# Patient Record
Sex: Male | Born: 1940 | Race: Black or African American | Hispanic: No | Marital: Married | Smoking: Current every day smoker
Health system: Southern US, Community
[De-identification: ages and names within clinical notes are randomized; demographics above are authoritative.]

## PROBLEM LIST (undated history)

## (undated) DIAGNOSIS — I10 Essential (primary) hypertension: Secondary | ICD-10-CM

## (undated) DIAGNOSIS — E119 Type 2 diabetes mellitus without complications: Secondary | ICD-10-CM

---

## 2003-05-28 ENCOUNTER — Encounter: Payer: Self-pay | Admitting: Family Medicine

## 2003-05-28 ENCOUNTER — Ambulatory Visit (HOSPITAL_COMMUNITY): Admission: RE | Admit: 2003-05-28 | Discharge: 2003-05-28 | Payer: Self-pay | Admitting: Family Medicine

## 2004-12-15 ENCOUNTER — Ambulatory Visit: Payer: Self-pay | Admitting: Family Medicine

## 2005-02-08 ENCOUNTER — Ambulatory Visit: Payer: Self-pay | Admitting: Family Medicine

## 2005-06-06 ENCOUNTER — Ambulatory Visit: Payer: Self-pay | Admitting: Family Medicine

## 2009-10-13 ENCOUNTER — Encounter: Payer: Self-pay | Admitting: Family Medicine

## 2011-03-14 ENCOUNTER — Other Ambulatory Visit (HOSPITAL_COMMUNITY): Payer: Self-pay | Admitting: Family Medicine

## 2011-03-14 DIAGNOSIS — N6452 Nipple discharge: Secondary | ICD-10-CM

## 2011-03-14 DIAGNOSIS — N6310 Unspecified lump in the right breast, unspecified quadrant: Secondary | ICD-10-CM

## 2011-03-21 ENCOUNTER — Other Ambulatory Visit (HOSPITAL_COMMUNITY): Payer: Self-pay | Admitting: Family Medicine

## 2011-03-21 ENCOUNTER — Ambulatory Visit (HOSPITAL_COMMUNITY)
Admission: RE | Admit: 2011-03-21 | Discharge: 2011-03-21 | Disposition: A | Discharge status: Home / Self Care | Payer: Medicare FFS | Source: Ambulatory Visit | Attending: Family Medicine | Admitting: Family Medicine

## 2011-03-21 ENCOUNTER — Other Ambulatory Visit: Payer: Self-pay | Admitting: Diagnostic Radiology

## 2011-03-21 DIAGNOSIS — N6310 Unspecified lump in the right breast, unspecified quadrant: Secondary | ICD-10-CM

## 2011-03-21 DIAGNOSIS — N6452 Nipple discharge: Secondary | ICD-10-CM

## 2011-03-21 DIAGNOSIS — N6039 Fibrosclerosis of unspecified breast: Secondary | ICD-10-CM | POA: Insufficient documentation

## 2011-03-21 DIAGNOSIS — N63 Unspecified lump in unspecified breast: Secondary | ICD-10-CM | POA: Insufficient documentation

## 2012-06-21 IMAGING — US US BIOPSY
1 series · 8 of 8 positions shown · non-contrast
Comparison: none

***ADDENDUM*** CREATED: 03/23/2011 [DATE]

Pathology revealed dense inflammation, including foreign body giant
cell reaction and fibrosis in the right breast. The left breast
showed benign breast parenchyma with fibrosis and chronic
inflammation. This was found to be concordant by Dr. Tiger
Ulises. Pathology was relayed to the patient's wife by the patient's
request. She stated that he had done well after the biopsy. Post
biopsy instructions were reviewed and her questions were answered.
She was encouraged to call The [REDACTED]
for any additional concerns. No additional follow-up is needed.
Pathology results are dictated by Alem Picado RN, BSN on March 23, 2011.
***END ADDENDUM*** SIGNED BY: Yama Paschal, M.D.
CLINICAL DATA: Bilateral subareolar masses, right larger than
left.

[Series 1: us biopsy · 0.08mm/px · 8 of 8 slices shown]
[im 1/8]
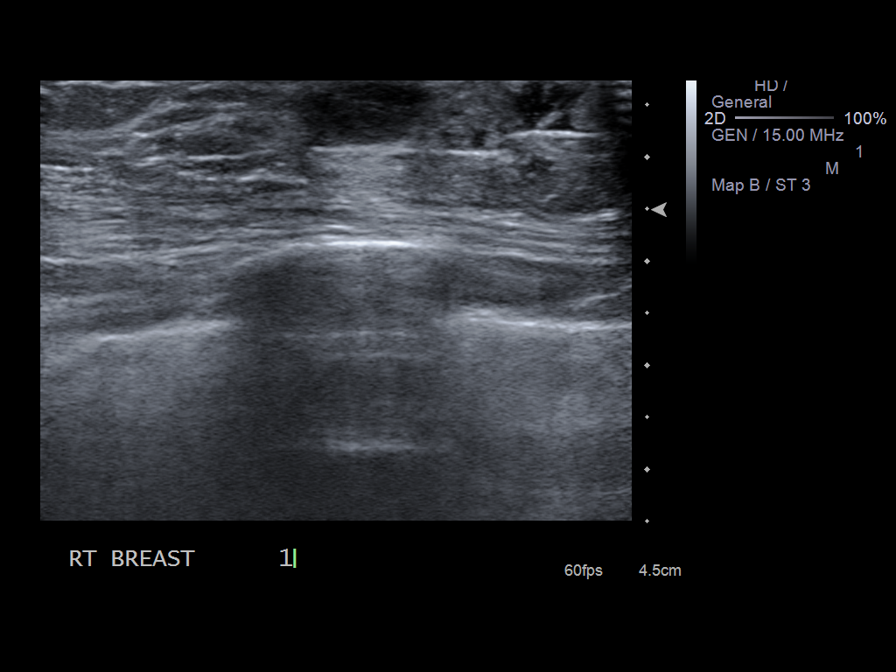
[im 2/8]
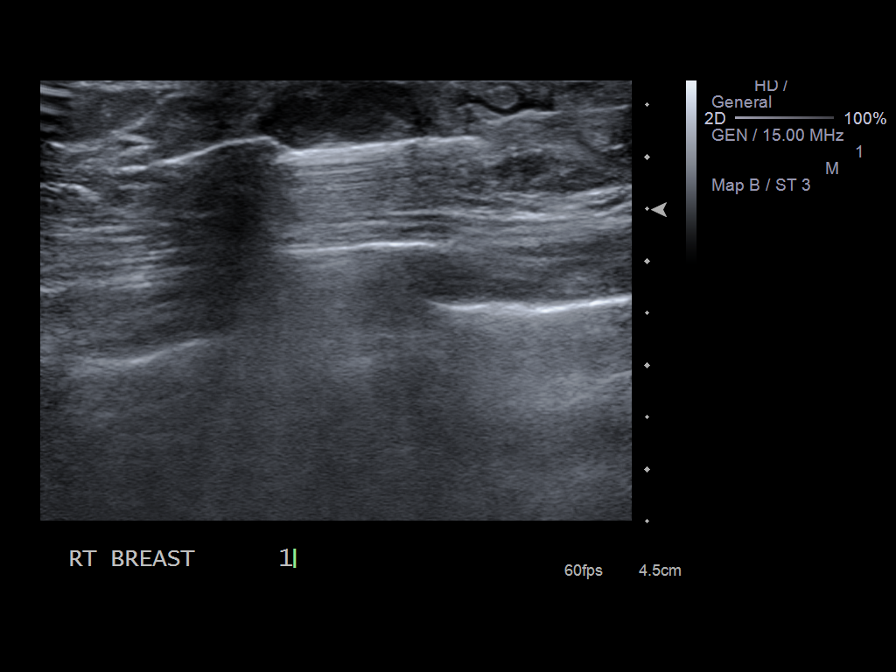
[im 3/8]
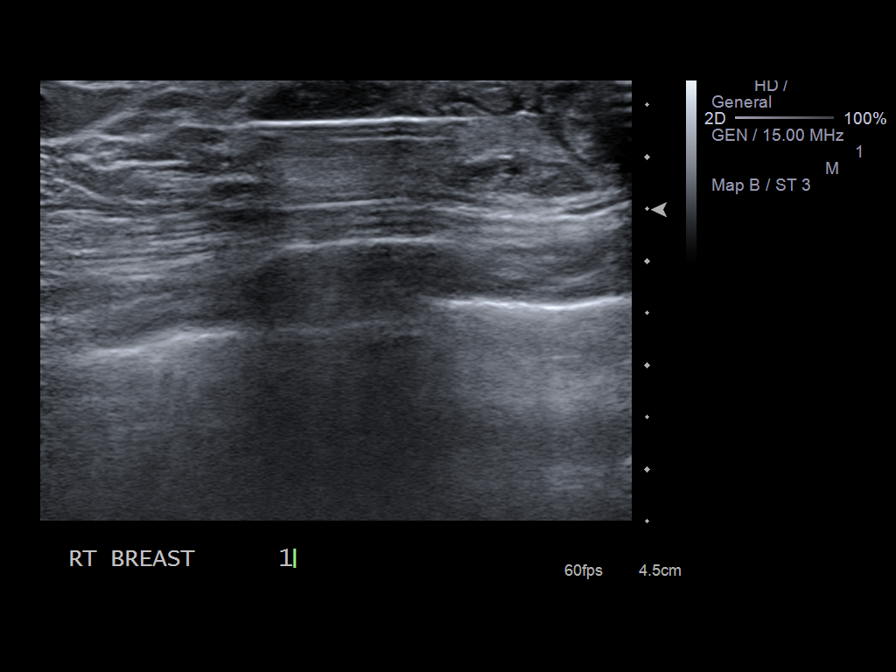
[im 4/8]
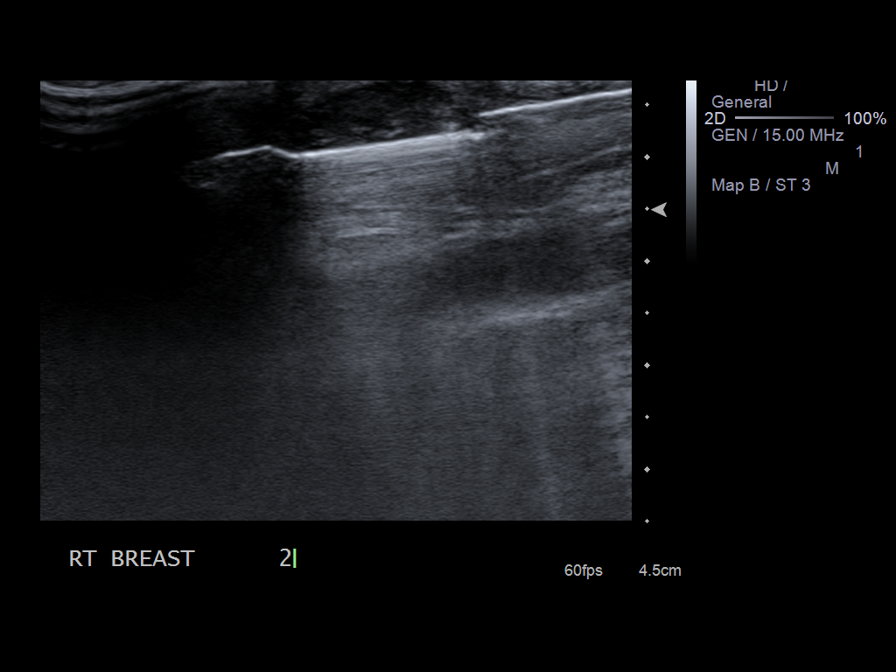
[im 5/8]
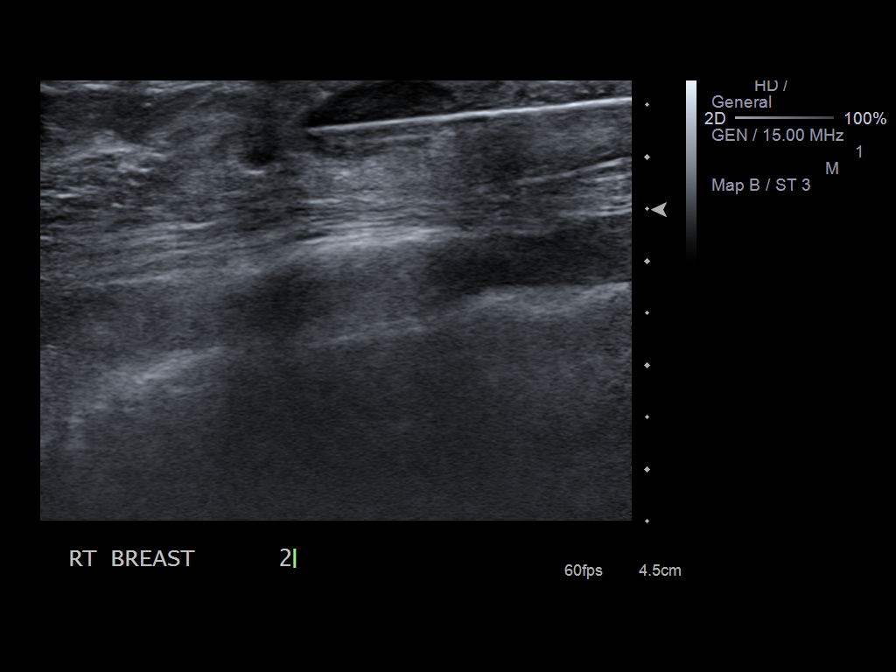
[im 6/8]
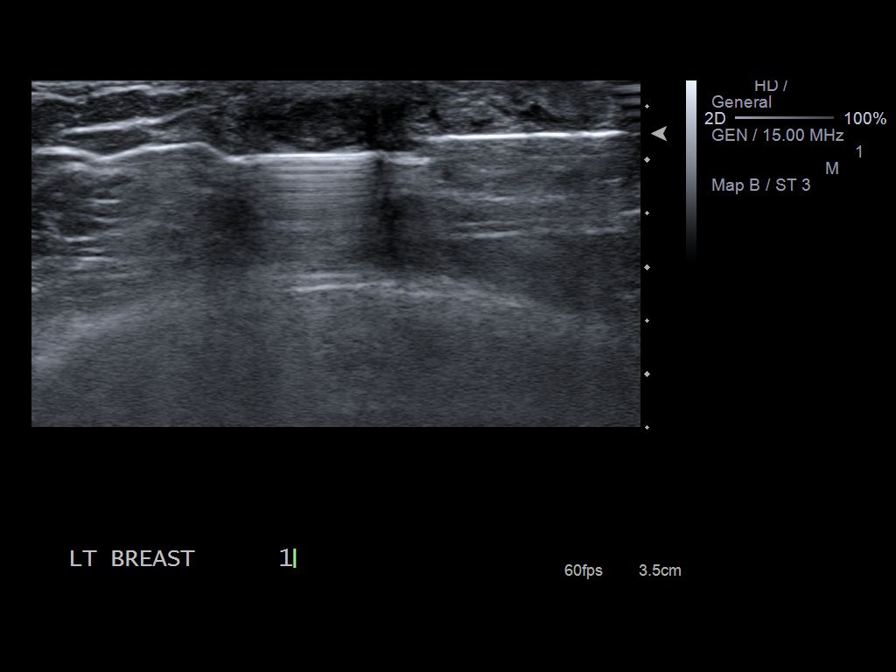
[im 7/8]
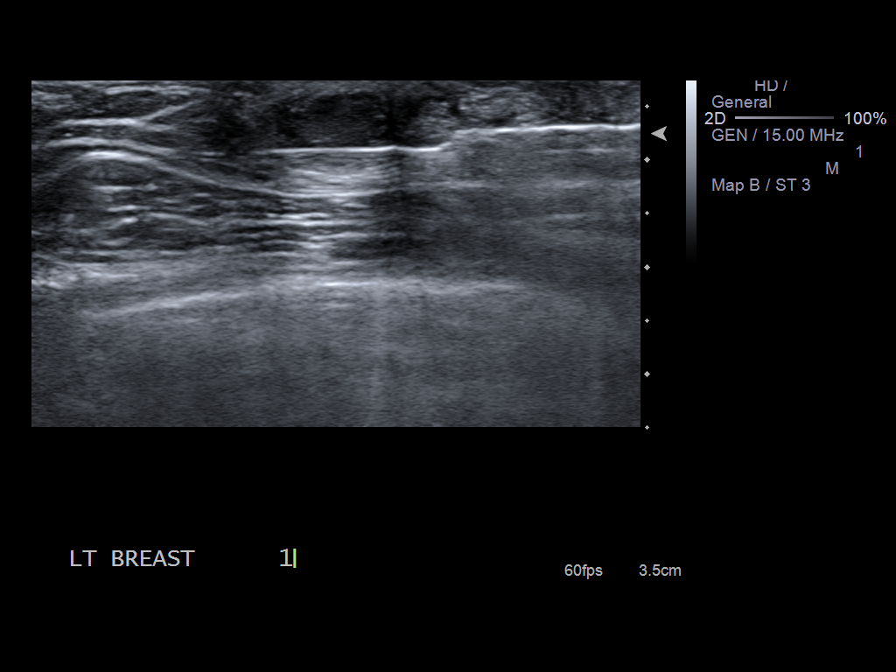
[im 8/8]
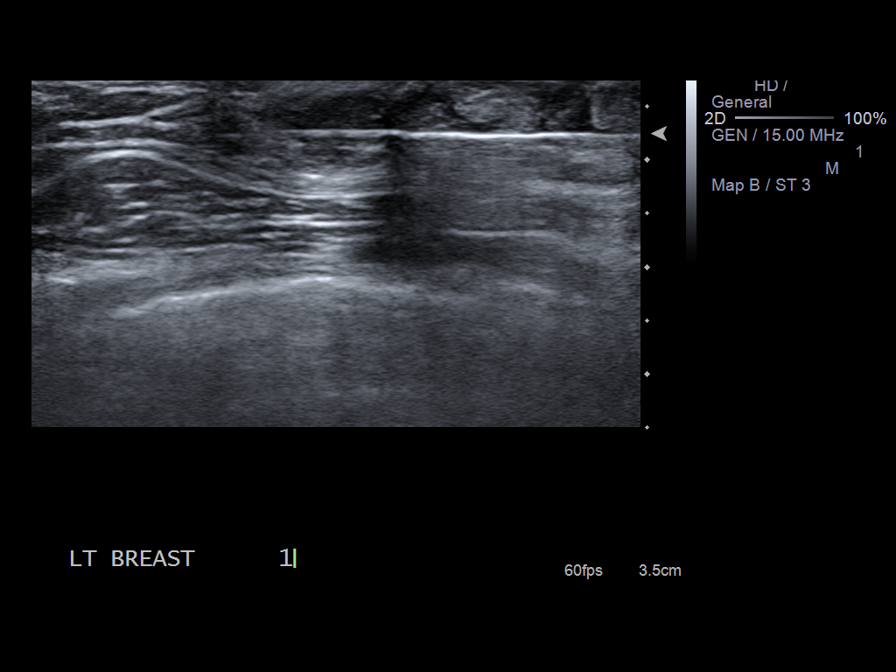

[8 of 8 positions shown; findings below may reference images not displayed]

ULTRASOUND GUIDED VACUUM ASSISTED CORE BIOPSY OF THE RIGHT AND LEFT
BREAST

The patient and I discussed the procedure of ultrasound-guided
biopsy, including risks, benefits and alternatives.  Specifically,
we discussed the risks of infection, bleeding, tissue injury and
inadequate sampling.  Informed written consent was given.

Using sterile technique, 2% lidocaine, ultrasound guidance and a 12
gauge vacuum assisted needle, biopsy was performed of the right
subareolar mass.

Using sterile technique, 2% lidocaine, ultrasound guidance and a 12
gauge vacuum-assisted needle, biopsy was performed of the left
subareolar mass.  No clips were placed.
IMPRESSION: Ultrasound-guided biopsy of a subareolar mass in each breast.
Pathologic results are pending.  No apparent complications.

## 2013-12-21 ENCOUNTER — Encounter (HOSPITAL_COMMUNITY): Payer: Self-pay | Admitting: Pharmacy Technician

## 2013-12-29 NOTE — Patient Instructions (Signed)
Your procedure is scheduled on: 01/04/2014  [facility] at  800 AM.  Call this number if you have problems the morning of surgery: [phone]   Do not eat food or drink liquids :After Midnight.      Take these medicines the morning of surgery with A SIP OF WATER: norvasc, lisinopril   Do not wear jewelry, make-up or nail polish.  Do not wear lotions, powders, or perfumes.   Do not shave 48 hours prior to surgery.  Do not bring valuables to the hospital.  Contacts, dentures or bridgework may not be worn into surgery.  Leave suitcase in the car. After surgery it may be brought to your room.  For patients admitted to the hospital, checkout time is 11:00 AM the day of discharge.   Patients discharged the day of surgery will not be allowed to drive home.  :     Please read over the following fact sheets that you were given: Coughing and Deep Breathing, Surgical Site Infection Prevention, Anesthesia Post-op Instructions and Care and Recovery After Surgery    Cataract A cataract is a clouding of the lens of the eye. When a lens becomes cloudy, vision is reduced based on the degree and nature of the clouding. Many cataracts reduce vision to some degree. Some cataracts make people more near-sighted as they develop. Other cataracts increase glare. Cataracts that are ignored and become worse can sometimes look white. The white color can be seen through the pupil. CAUSES   Aging. However, cataracts may occur at any age, even in newborns.   Certain drugs.   Trauma to the eye.   Certain diseases such as diabetes.   Specific eye diseases such as chronic inflammation inside the eye or a sudden attack of a rare form of glaucoma.   Inherited or acquired medical problems.  SYMPTOMS   Gradual, progressive drop in vision in the affected eye.   Severe, rapid visual loss. This most often happens when trauma is the cause.  DIAGNOSIS  To detect a cataract, an eye doctor examines the lens.  Cataracts are best diagnosed with an exam of the eyes with the pupils enlarged (dilated) by drops.  TREATMENT  For an early cataract, vision may improve by using different eyeglasses or stronger lighting. If that does not help your vision, surgery is the only effective treatment. A cataract needs to be surgically removed when vision loss interferes with your everyday activities, such as driving, reading, or watching TV. A cataract may also have to be removed if it prevents examination or treatment of another eye problem. Surgery removes the cloudy lens and usually replaces it with a substitute lens (intraocular lens, IOL).  At a time when both you and your doctor agree, the cataract will be surgically removed. If you have cataracts in both eyes, only one is usually removed at a time. This allows the operated eye to heal and be out of danger from any possible problems after surgery (such as infection or poor wound healing). In rare cases, a cataract may be doing damage to your eye. In these cases, your caregiver may advise surgical removal right away. The vast majority of people who have cataract surgery have better vision afterward. HOME CARE INSTRUCTIONS  If you are not planning surgery, you may be asked to do the following:  Use different eyeglasses.   Use stronger or brighter lighting.   Ask your eye doctor about reducing your medicine dose or changing medicines if  it is thought that a medicine caused your cataract. Changing medicines does not make the cataract go away on its own.   Become familiar with your surroundings. Poor vision can lead to injury. Avoid bumping into things on the affected side. You are at a higher risk for tripping or falling.   Exercise extreme care when driving or operating machinery.   Wear sunglasses if you are sensitive to bright light or experiencing problems with glare.  SEEK IMMEDIATE MEDICAL CARE IF:   You have a worsening or sudden vision loss.   You notice  redness, swelling, or increasing pain in the eye.   You have a fever.  Document Released: 11/26/2005 Document Revised: 11/15/2011 Document Reviewed: 07/20/2011 [facility] Patient Information 2012 [place].PATIENT INSTRUCTIONS POST-ANESTHESIA  IMMEDIATELY FOLLOWING SURGERY:  Do not drive or operate machinery for the first twenty four hours after surgery.  Do not make any important decisions for twenty four hours after surgery or while taking narcotic pain medications or sedatives.  If you develop intractable nausea and vomiting or a severe headache please notify your doctor immediately.  FOLLOW-UP:  Please make an appointment with your surgeon as instructed. You do not need to follow up with anesthesia unless specifically instructed to do so.  WOUND CARE INSTRUCTIONS (if applicable):  Keep a dry clean dressing on the anesthesia/puncture wound site if there is drainage.  Once the wound has quit draining you may leave it open to air.  Generally you should leave the bandage intact for twenty four hours unless there is drainage.  If the epidural site drains for more than 36-48 hours please call the anesthesia department.  QUESTIONS?:  Please feel free to call your physician or the hospital operator if you have any questions, and they will be happy to assist you.

## 2013-12-30 ENCOUNTER — Other Ambulatory Visit: Payer: Self-pay

## 2013-12-30 ENCOUNTER — Encounter (HOSPITAL_COMMUNITY): Payer: Self-pay

## 2013-12-30 ENCOUNTER — Encounter (HOSPITAL_COMMUNITY)
Admission: RE | Admit: 2013-12-30 | Discharge: 2013-12-30 | Disposition: A | Discharge status: Home / Self Care | Payer: Medicare FFS | Source: Ambulatory Visit | Attending: Ophthalmology | Admitting: Ophthalmology

## 2013-12-30 DIAGNOSIS — Z01818 Encounter for other preprocedural examination: Secondary | ICD-10-CM | POA: Insufficient documentation

## 2013-12-30 DIAGNOSIS — Z01812 Encounter for preprocedural laboratory examination: Secondary | ICD-10-CM | POA: Insufficient documentation

## 2013-12-30 DIAGNOSIS — Z01810 Encounter for preprocedural cardiovascular examination: Secondary | ICD-10-CM | POA: Insufficient documentation

## 2013-12-30 HISTORY — DX: Essential (primary) hypertension: I10

## 2013-12-30 HISTORY — DX: Type 2 diabetes mellitus without complications: E11.9

## 2013-12-30 LAB — HEMOGLOBIN AND HEMATOCRIT, BLOOD
HCT: 41.9 % (ref 39.0–52.0)
HEMOGLOBIN: 14.2 g/dL (ref 13.0–17.0)

## 2013-12-30 LAB — BASIC METABOLIC PANEL
BUN: 18 mg/dL (ref 6–23)
CHLORIDE: 99 meq/L (ref 96–112)
CO2: 26 mEq/L (ref 19–32)
CREATININE: 1.46 mg/dL — AB (ref 0.50–1.35)
Calcium: 9.7 mg/dL (ref 8.4–10.5)
GFR, EST AFRICAN AMERICAN: 54 mL/min — AB (ref >90)
GFR, EST NON AFRICAN AMERICAN: 46 mL/min — AB (ref >90)
Glucose, Bld: 230 mg/dL — ABNORMAL HIGH (ref 70–99)
Potassium: 4.4 mEq/L (ref 3.7–5.3)
Sodium: 138 mEq/L (ref 137–147)

## 2014-01-01 MED ORDER — PHENYLEPHRINE HCL 2.5 % OP SOLN
OPHTHALMIC | Status: AC
  Filled 2014-01-01: qty 15

## 2014-01-01 MED ORDER — TETRACAINE HCL 0.5 % OP SOLN
OPHTHALMIC | Status: AC
  Filled 2014-01-01: qty 2

## 2014-01-01 MED ORDER — NEOMYCIN-POLYMYXIN-DEXAMETH 3.5-10000-0.1 OP SUSP
OPHTHALMIC | Status: AC
  Filled 2014-01-01: qty 5

## 2014-01-01 MED ORDER — LIDOCAINE HCL 3.5 % OP GEL
OPHTHALMIC | Status: AC
  Filled 2014-01-01: qty 1

## 2014-01-01 MED ORDER — LIDOCAINE HCL (PF) 1 % IJ SOLN
INTRAMUSCULAR | Status: AC
  Filled 2014-01-01: qty 2

## 2014-01-01 MED ORDER — CYCLOPENTOLATE-PHENYLEPHRINE OP SOLN OPTIME - NO CHARGE
OPHTHALMIC | Status: AC
  Filled 2014-01-01: qty 2

## 2014-01-04 ENCOUNTER — Ambulatory Visit (HOSPITAL_COMMUNITY)
Admission: RE | Admit: 2014-01-04 | Discharge: 2014-01-04 | Disposition: A | Discharge status: Home / Self Care | Payer: Medicare FFS | Source: Ambulatory Visit | Attending: Ophthalmology | Admitting: Ophthalmology

## 2014-01-04 ENCOUNTER — Encounter (HOSPITAL_COMMUNITY): Payer: Self-pay | Admitting: *Deleted

## 2014-01-04 ENCOUNTER — Encounter (HOSPITAL_COMMUNITY)
Admission: RE | Disposition: A | Discharge status: Home / Self Care | Payer: Self-pay | Source: Ambulatory Visit | Attending: Ophthalmology

## 2014-01-04 ENCOUNTER — Encounter (HOSPITAL_COMMUNITY): Payer: Medicare FFS | Admitting: Anesthesiology

## 2014-01-04 ENCOUNTER — Ambulatory Visit (HOSPITAL_COMMUNITY): Payer: Medicare FFS | Admitting: Anesthesiology

## 2014-01-04 DIAGNOSIS — Z79899 Other long term (current) drug therapy: Secondary | ICD-10-CM | POA: Insufficient documentation

## 2014-01-04 DIAGNOSIS — IMO0002 Reserved for concepts with insufficient information to code with codable children: Secondary | ICD-10-CM | POA: Insufficient documentation

## 2014-01-04 DIAGNOSIS — E119 Type 2 diabetes mellitus without complications: Secondary | ICD-10-CM | POA: Insufficient documentation

## 2014-01-04 DIAGNOSIS — I10 Essential (primary) hypertension: Secondary | ICD-10-CM | POA: Insufficient documentation

## 2014-01-04 DIAGNOSIS — Z01812 Encounter for preprocedural laboratory examination: Secondary | ICD-10-CM | POA: Insufficient documentation

## 2014-01-04 HISTORY — PX: CATARACT EXTRACTION W/PHACO: SHX586

## 2014-01-04 LAB — GLUCOSE, CAPILLARY: Glucose-Capillary: 148 mg/dL — ABNORMAL HIGH (ref 70–99)

## 2014-01-04 SURGERY — PHACOEMULSIFICATION, CATARACT, WITH IOL INSERTION
Anesthesia: Monitor Anesthesia Care | Site: Eye | Laterality: Right

## 2014-01-04 MED ORDER — FENTANYL CITRATE 0.05 MG/ML IJ SOLN
25.0000 ug | INTRAMUSCULAR | Status: AC
  Administered 2014-01-04 (×2): 25 ug via INTRAVENOUS

## 2014-01-04 MED ORDER — MIDAZOLAM HCL 2 MG/2ML IJ SOLN
1.0000 mg | INTRAMUSCULAR | Status: DC | PRN
  Administered 2014-01-04: 2 mg via INTRAVENOUS

## 2014-01-04 MED ORDER — PHENYLEPHRINE HCL 2.5 % OP SOLN
1.0000 [drp] | OPHTHALMIC | Status: AC
  Administered 2014-01-04 (×3): 1 [drp] via OPHTHALMIC

## 2014-01-04 MED ORDER — EPINEPHRINE HCL 1 MG/ML IJ SOLN
INTRAMUSCULAR | Status: AC
  Filled 2014-01-04: qty 1

## 2014-01-04 MED ORDER — CYCLOPENTOLATE-PHENYLEPHRINE 0.2-1 % OP SOLN
1.0000 [drp] | OPHTHALMIC | Status: AC
  Administered 2014-01-04 (×3): 1 [drp] via OPHTHALMIC

## 2014-01-04 MED ORDER — CARBACHOL 0.01 % IO SOLN
INTRAOCULAR | Status: DC | PRN
  Administered 2014-01-04: 0.5 mL via INTRAOCULAR

## 2014-01-04 MED ORDER — POVIDONE-IODINE 5 % OP SOLN
OPHTHALMIC | Status: DC | PRN
  Administered 2014-01-04: 1 via OPHTHALMIC

## 2014-01-04 MED ORDER — LACTATED RINGERS IV SOLN
INTRAVENOUS | Status: DC
  Administered 2014-01-04: 09:00:00 via INTRAVENOUS

## 2014-01-04 MED ORDER — LIDOCAINE HCL 3.5 % OP GEL: 1.0000 "application " | Freq: Once | OPHTHALMIC | Status: DC

## 2014-01-04 MED ORDER — MIDAZOLAM HCL 2 MG/2ML IJ SOLN
INTRAMUSCULAR | Status: AC
  Filled 2014-01-04: qty 2

## 2014-01-04 MED ORDER — FENTANYL CITRATE 0.05 MG/ML IJ SOLN
INTRAMUSCULAR | Status: AC
  Filled 2014-01-04: qty 2

## 2014-01-04 MED ORDER — TETRACAINE HCL 0.5 % OP SOLN
1.0000 [drp] | OPHTHALMIC | Status: AC
  Administered 2014-01-04 (×3): 1 [drp] via OPHTHALMIC

## 2014-01-04 MED ORDER — NEOMYCIN-POLYMYXIN-DEXAMETH 3.5-10000-0.1 OP SUSP
OPHTHALMIC | Status: DC | PRN
  Administered 2014-01-04: 2 [drp] via OPHTHALMIC

## 2014-01-04 MED ORDER — LIDOCAINE HCL (PF) 1 % IJ SOLN
INTRAMUSCULAR | Status: DC | PRN
  Administered 2014-01-04: .6 mL

## 2014-01-04 MED ORDER — EPINEPHRINE HCL 1 MG/ML IJ SOLN
INTRAMUSCULAR | Status: DC | PRN
  Administered 2014-01-04: 09:00:00

## 2014-01-04 MED ORDER — LIDOCAINE 3.5 % OP GEL OPTIME - NO CHARGE
OPHTHALMIC | Status: DC | PRN
  Administered 2014-01-04: 2 [drp] via OPHTHALMIC

## 2014-01-04 MED ORDER — CARBACHOL 0.01 % IO SOLN
INTRAOCULAR | Status: AC
  Filled 2014-01-04: qty 1.5

## 2014-01-04 MED ORDER — PROVISC 10 MG/ML IO SOLN
INTRAOCULAR | Status: DC | PRN
  Administered 2014-01-04 (×2): 0.85 mL via INTRAOCULAR

## 2014-01-04 MED ORDER — TRYPAN BLUE 0.06 % OP SOLN
OPHTHALMIC | Status: DC | PRN
  Administered 2014-01-04: 0.5 mL via INTRAOCULAR

## 2014-01-04 MED ORDER — TRYPAN BLUE 0.06 % OP SOLN
OPHTHALMIC | Status: AC
  Filled 2014-01-04: qty 0.5

## 2014-01-04 MED ORDER — BSS IO SOLN
INTRAOCULAR | Status: DC | PRN
  Administered 2014-01-04: 15 mL via INTRAOCULAR

## 2014-01-04 SURGICAL SUPPLY — 33 items
CAPSULAR TENSION RING-AMO (OPHTHALMIC RELATED) IMPLANT
CLOTH BEACON ORANGE TIMEOUT ST (SAFETY) ×3 IMPLANT
EYE SHIELD UNIVERSAL CLEAR (GAUZE/BANDAGES/DRESSINGS) ×3 IMPLANT
GLOVE BIO SURGEON STRL SZ 6.5 (GLOVE) IMPLANT
GLOVE BIO SURGEONS STRL SZ 6.5 (GLOVE)
GLOVE BIOGEL PI IND STRL 6.5 (GLOVE) ×1 IMPLANT
GLOVE BIOGEL PI IND STRL 7.0 (GLOVE) IMPLANT
GLOVE BIOGEL PI IND STRL 7.5 (GLOVE) IMPLANT
GLOVE BIOGEL PI INDICATOR 6.5 (GLOVE) ×2
GLOVE BIOGEL PI INDICATOR 7.0 (GLOVE)
GLOVE BIOGEL PI INDICATOR 7.5 (GLOVE)
GLOVE ECLIPSE 6.5 STRL STRAW (GLOVE) IMPLANT
GLOVE ECLIPSE 7.0 STRL STRAW (GLOVE) IMPLANT
GLOVE ECLIPSE 7.5 STRL STRAW (GLOVE) IMPLANT
GLOVE EXAM NITRILE LRG STRL (GLOVE) IMPLANT
GLOVE EXAM NITRILE MD LF STRL (GLOVE) ×3 IMPLANT
GLOVE SKINSENSE NS SZ6.5 (GLOVE)
GLOVE SKINSENSE NS SZ7.0 (GLOVE)
GLOVE SKINSENSE STRL SZ6.5 (GLOVE) IMPLANT
GLOVE SKINSENSE STRL SZ7.0 (GLOVE) IMPLANT
KIT VITRECTOMY (OPHTHALMIC RELATED) IMPLANT
PAD ARMBOARD 7.5X6 YLW CONV (MISCELLANEOUS) ×3 IMPLANT
PROC W NO LENS (INTRAOCULAR LENS)
PROC W SPEC LENS (INTRAOCULAR LENS)
PROCESS W NO LENS (INTRAOCULAR LENS) IMPLANT
PROCESS W SPEC LENS (INTRAOCULAR LENS) IMPLANT
RING MALYGIN (MISCELLANEOUS) IMPLANT
SIGHTPATH CAT PROC W REG LENS (Ophthalmic Related) ×3 IMPLANT
SYR TB 1ML LL NO SAFETY (SYRINGE) ×3 IMPLANT
TAPE SURG TRANSPORE 1 IN (GAUZE/BANDAGES/DRESSINGS) ×1 IMPLANT
TAPE SURGICAL TRANSPORE 1 IN (GAUZE/BANDAGES/DRESSINGS) ×2
VISCOELASTIC ADDITIONAL (OPHTHALMIC RELATED) IMPLANT
WATER STERILE IRR 250ML POUR (IV SOLUTION) ×3 IMPLANT

## 2014-01-04 NOTE — Transfer of Care (Signed)
Immediate Anesthesia Transfer of Care Note  Patient: Alan Watson  Procedure(s) Performed: Procedure(s) with comments: CATARACT EXTRACTION PHACO AND INTRAOCULAR LENS PLACEMENT (IOC) (Right) - CDE:  8.07  Patient Location: Short Stay  Anesthesia Type:MAC  Level of Consciousness: awake  Airway & Oxygen Therapy: Patient Spontanous Breathing  Post-op Assessment: Report given to PACU RN  Post vital signs: Reviewed  Complications: No apparent anesthesia complications

## 2014-01-04 NOTE — Discharge Instructions (Signed)
Monitored Anesthesia Care  °Monitored anesthesia care is an anesthesia service for a medical procedure. Anesthesia is the loss of the ability to feel pain. It is produced by medications called anesthetics. It may affect a small area of your body (local anesthesia), a large area of your body (regional anesthesia), or your entire body (general anesthesia). The need for monitored anesthesia care depends your procedure, your condition, and the potential need for regional or general anesthesia. It is often provided during procedures where:  °· General anesthesia may be needed if there are complications. This is because you need special care when you are under general anesthesia.   °· You will be under local or regional anesthesia. This is so that you are able to have higher levels of anesthesia if needed.   °· You will receive calming medications (sedatives). This is especially the case if sedatives are given to put you in a semi-conscious state of relaxation (deep sedation). This is because the amount of sedative needed to produce this state can be hard to predict. Too much of a sedative can produce general anesthesia. °Monitored anesthesia care is performed by one or more caregivers who have special training in all types of anesthesia. You will need to meet with these caregivers before your procedure. During this meeting, they will ask you about your medical history. They will also give you instructions to follow. (For example, you will need to stop eating and drinking before your procedure. You may also need to stop or change medications you are taking.) During your procedure, your caregivers will stay with you. They will:  °· Watch your condition. This includes watching you blood pressure, breathing, and level of pain.   °· Diagnose and treat problems that occur.   °· Give medications if they are needed. These may include calming medications (sedatives) and anesthetics.   °· Make sure you are comfortable.   °Having  monitored anesthesia care does not necessarily mean that you will be under anesthesia. It does mean that your caregivers will be able to manage anesthesia if you need it or if it occurs. It also means that you will be able to have a different type of anesthesia than you are having if you need it. When your procedure is complete, your caregivers will continue to watch your condition. They will make sure any medications wear off before you are allowed to go home.  °Document Released: 08/22/2005 Document Revised: 03/23/2013 Document Reviewed: 01/07/2013 °[employer]® Patient Information ©2014 [employer]. ° °

## 2014-01-04 NOTE — H&P (Signed)
I have reviewed the H&P, the patient was re-examined, and I have identified no interval changes in medical condition and plan of care since the history and physical of record  

## 2014-01-04 NOTE — Anesthesia Postprocedure Evaluation (Signed)
  Anesthesia Post-op Note  Patient: Alan Watson  Procedure(s) Performed: Procedure(s) with comments: CATARACT EXTRACTION PHACO AND INTRAOCULAR LENS PLACEMENT (IOC) (Right) - CDE:  8.07  Patient Location: Short Stay  Anesthesia Type:MAC  Level of Consciousness: awake, alert  and oriented  Airway and Oxygen Therapy: Patient Spontanous Breathing  Post-op Pain: none  Post-op Assessment: Post-op Vital signs reviewed, Patient's Cardiovascular Status Stable, Respiratory Function Stable, Patent Airway and No signs of Nausea or vomiting  Post-op Vital Signs: Reviewed and stable  Complications: No apparent anesthesia complications

## 2014-01-04 NOTE — Anesthesia Preprocedure Evaluation (Signed)
Anesthesia Evaluation  Patient identified by MRN, date of birth, ID band Patient awake    Reviewed: Allergy & Precautions, H&P , NPO status , Patient's Chart, lab work & pertinent test results  Airway Mallampati: II TM Distance: >3 FB     Dental  (+) Teeth Intact   Pulmonary Current Smoker (am cough),  breath sounds clear to auscultation        Cardiovascular hypertension, Pt. on medications Rhythm:Regular Rate:Normal     Neuro/Psych    GI/Hepatic   Endo/Other  diabetes, Type 2, Oral Hypoglycemic Agents  Renal/GU      Musculoskeletal   Abdominal   Peds  Hematology   Anesthesia Other Findings   Reproductive/Obstetrics                           Anesthesia Physical Anesthesia Plan  ASA: III  Anesthesia Plan: MAC   Post-op Pain Management:    Induction: Intravenous  Airway Management Planned: Nasal Cannula  Additional Equipment:   Intra-op Plan:   Post-operative Plan:   Informed Consent: I have reviewed the patients History and Physical, chart, labs and discussed the procedure including the risks, benefits and alternatives for the proposed anesthesia with the patient or authorized representative who has indicated his/her understanding and acceptance.     Plan Discussed with:   Anesthesia Plan Comments:         Anesthesia Quick Evaluation

## 2014-01-04 NOTE — Op Note (Signed)
Date of Admission: 01/04/2014  Date of Surgery: 01/04/2014  Pre-Op Dx: Cataract  Right  Eye  Post-Op Dx: Mature Cataract  Right  Eye,  Dx Code [phone]  Surgeon: Gemma PayorKerry Exzavier Ruderman, M.D.  Assistants: None  Anesthesia: Topical with MAC  Indications: Painless, progressive loss of vision with compromise of daily activities.  Surgery: Cataract Extraction with Intraocular lens Implant Right Eye  Discription: The patient had dilating drops and viscous lidocaine placed into the left eye in the pre-op holding area. After transfer to the operating room, a time out was performed. The patient was then prepped and draped. Beginning with a 75 degree blade a paracentesis port was made at the surgeon's 2 o'clock position. The anterior chamber was then filled with 1% non-preserved lidocaine. The anterior chamber was then filled with Vision Blue to stain the anterior capsule. The Vision Blue was displaced from the anterior chamber with BSS. This was followed by filling the anterior chamber with Viscoat. A 2.474mm keratome blade was used to make a clear corneal incision at the temporal limbus. A bent cystatome needle was used to start a capsulotomy. The tear raialized to the periphery. Inferiorly. The cystotome needele was then used to finish a can-openred type capsulotomy. Gentle hydrodissection was performed with balanced salt solution on a Fine canula. The lens nucleus was then removed using the phacoemulsification handpiece. Residual cortex was removed with the I&A handpiece. The anterior chamber and anterior to the ciliary sulcus was refilled with Provisc. The wound was widened to 2.1075mm with the keratome blade. A posterior chamber intraocular lens was placed into the ciliary sulcu with it's injector. The implant was positioned with the Kuglan hook. The Provisc was then removed from the anterior chamber with the I&A handpiece. Miostat was placed into the anterior chamber to insure there was no IOL capture. Stromal hydration  of the main incision and paracentesis port was performed with BSS on a Fine canula. The wounds were tested for leak which was negative. The patient tolerated the procedure well. There were no operative complications. The patient was then transferred to the recovery room in stable condition.  Complications: None  Specimen: None  EBL: None  Prosthetic device: Alcon, MA60AC, power 20.5D, SN [id number].

## 2014-01-05 ENCOUNTER — Encounter (HOSPITAL_COMMUNITY): Payer: Self-pay | Admitting: Ophthalmology

## 2014-01-11 ENCOUNTER — Encounter (HOSPITAL_COMMUNITY): Payer: Self-pay | Admitting: Pharmacy Technician

## 2014-01-12 ENCOUNTER — Encounter (HOSPITAL_COMMUNITY)
Admission: RE | Admit: 2014-01-12 | Discharge: 2014-01-12 | Disposition: A | Discharge status: Home / Self Care | Payer: Medicare FFS | Source: Ambulatory Visit | Attending: Ophthalmology | Admitting: Ophthalmology

## 2014-01-12 ENCOUNTER — Encounter (HOSPITAL_COMMUNITY): Payer: Self-pay

## 2014-01-12 MED ORDER — ONDANSETRON HCL 4 MG/2ML IJ SOLN: 4.0000 mg | Freq: Once | INTRAMUSCULAR | Status: AC | PRN

## 2014-01-12 MED ORDER — FENTANYL CITRATE 0.05 MG/ML IJ SOLN: 25.0000 ug | INTRAMUSCULAR | Status: DC | PRN

## 2014-01-12 NOTE — Patient Instructions (Signed)
Your procedure is scheduled on: 01/14/2014  [facility] at  0830  AM.  Call this number if you have problems the morning of surgery: [phone]   Do not eat food or drink liquids :After Midnight.      Take these medicines the morning of surgery with A SIP OF WATER: amlodipine, lisinopril-hydrochlorathiazide   Do not wear jewelry, make-up or nail polish.  Do not wear lotions, powders, or perfumes. You may wear deodorant.  Do not shave 48 hours prior to surgery.  Do not bring valuables to the hospital.  Contacts, dentures or bridgework may not be worn into surgery.  Leave suitcase in the car. After surgery it may be brought to your room.  For patients admitted to the hospital, checkout time is 11:00 AM the day of discharge.   Patients discharged the day of surgery will not be allowed to drive home.  :     Please read over the following fact sheets that you were given: Coughing and Deep Breathing, Surgical Site Infection Prevention, Anesthesia Post-op Instructions and Care and Recovery After Surgery    Cataract A cataract is a clouding of the lens of the eye. When a lens becomes cloudy, vision is reduced based on the degree and nature of the clouding. Many cataracts reduce vision to some degree. Some cataracts make people more near-sighted as they develop. Other cataracts increase glare. Cataracts that are ignored and become worse can sometimes look white. The white color can be seen through the pupil. CAUSES   Aging. However, cataracts may occur at any age, even in newborns.   Certain drugs.   Trauma to the eye.   Certain diseases such as diabetes.   Specific eye diseases such as chronic inflammation inside the eye or a sudden attack of a rare form of glaucoma.   Inherited or acquired medical problems.  SYMPTOMS   Gradual, progressive drop in vision in the affected eye.   Severe, rapid visual loss. This most often happens when trauma is the cause.  DIAGNOSIS  To detect  a cataract, an eye doctor examines the lens. Cataracts are best diagnosed with an exam of the eyes with the pupils enlarged (dilated) by drops.  TREATMENT  For an early cataract, vision may improve by using different eyeglasses or stronger lighting. If that does not help your vision, surgery is the only effective treatment. A cataract needs to be surgically removed when vision loss interferes with your everyday activities, such as driving, reading, or watching TV. A cataract may also have to be removed if it prevents examination or treatment of another eye problem. Surgery removes the cloudy lens and usually replaces it with a substitute lens (intraocular lens, IOL).  At a time when both you and your doctor agree, the cataract will be surgically removed. If you have cataracts in both eyes, only one is usually removed at a time. This allows the operated eye to heal and be out of danger from any possible problems after surgery (such as infection or poor wound healing). In rare cases, a cataract may be doing damage to your eye. In these cases, your caregiver may advise surgical removal right away. The vast majority of people who have cataract surgery have better vision afterward. HOME CARE INSTRUCTIONS  If you are not planning surgery, you may be asked to do the following:  Use different eyeglasses.   Use stronger or brighter lighting.   Ask your eye doctor about reducing your medicine dose  or changing medicines if it is thought that a medicine caused your cataract. Changing medicines does not make the cataract go away on its own.   Become familiar with your surroundings. Poor vision can lead to injury. Avoid bumping into things on the affected side. You are at a higher risk for tripping or falling.   Exercise extreme care when driving or operating machinery.   Wear sunglasses if you are sensitive to bright light or experiencing problems with glare.  SEEK IMMEDIATE MEDICAL CARE IF:   You have a  worsening or sudden vision loss.   You notice redness, swelling, or increasing pain in the eye.   You have a fever.  Document Released: 11/26/2005 Document Revised: 11/15/2011 Document Reviewed: 07/20/2011 [facility] Patient Information 2012 [place].PATIENT INSTRUCTIONS POST-ANESTHESIA  IMMEDIATELY FOLLOWING SURGERY:  Do not drive or operate machinery for the first twenty four hours after surgery.  Do not make any important decisions for twenty four hours after surgery or while taking narcotic pain medications or sedatives.  If you develop intractable nausea and vomiting or a severe headache please notify your doctor immediately.  FOLLOW-UP:  Please make an appointment with your surgeon as instructed. You do not need to follow up with anesthesia unless specifically instructed to do so.  WOUND CARE INSTRUCTIONS (if applicable):  Keep a dry clean dressing on the anesthesia/puncture wound site if there is drainage.  Once the wound has quit draining you may leave it open to air.  Generally you should leave the bandage intact for twenty four hours unless there is drainage.  If the epidural site drains for more than 36-48 hours please call the anesthesia department.  QUESTIONS?:  Please feel free to call your physician or the hospital operator if you have any questions, and they will be happy to assist you.

## 2014-01-13 MED ORDER — PHENYLEPHRINE HCL 2.5 % OP SOLN
OPHTHALMIC | Status: AC
  Filled 2014-01-13: qty 15

## 2014-01-13 MED ORDER — TETRACAINE HCL 0.5 % OP SOLN
OPHTHALMIC | Status: AC
  Filled 2014-01-13: qty 2

## 2014-01-13 MED ORDER — CYCLOPENTOLATE-PHENYLEPHRINE OP SOLN OPTIME - NO CHARGE
OPHTHALMIC | Status: AC
  Filled 2014-01-13: qty 2

## 2014-01-13 MED ORDER — LIDOCAINE HCL (PF) 1 % IJ SOLN
INTRAMUSCULAR | Status: AC
Start: 2014-01-13 — End: 2014-01-13
  Filled 2014-01-13: qty 2

## 2014-01-13 MED ORDER — NEOMYCIN-POLYMYXIN-DEXAMETH 3.5-10000-0.1 OP SUSP
OPHTHALMIC | Status: AC
  Filled 2014-01-13: qty 5

## 2014-01-13 MED ORDER — LIDOCAINE HCL 3.5 % OP GEL
OPHTHALMIC | Status: AC
  Filled 2014-01-13: qty 1

## 2014-01-14 ENCOUNTER — Encounter (HOSPITAL_COMMUNITY): Payer: Medicare FFS | Admitting: Anesthesiology

## 2014-01-14 ENCOUNTER — Encounter (HOSPITAL_COMMUNITY): Payer: Self-pay | Admitting: Ophthalmology

## 2014-01-14 ENCOUNTER — Encounter (HOSPITAL_COMMUNITY)
Admission: RE | Disposition: A | Discharge status: Home / Self Care | Payer: Self-pay | Source: Ambulatory Visit | Attending: Ophthalmology

## 2014-01-14 ENCOUNTER — Ambulatory Visit (HOSPITAL_COMMUNITY): Payer: Medicare FFS | Admitting: Anesthesiology

## 2014-01-14 ENCOUNTER — Ambulatory Visit (HOSPITAL_COMMUNITY)
Admission: RE | Admit: 2014-01-14 | Discharge: 2014-01-14 | Disposition: A | Discharge status: Home / Self Care | Payer: Medicare FFS | Source: Ambulatory Visit | Attending: Ophthalmology | Admitting: Ophthalmology

## 2014-01-14 DIAGNOSIS — H2589 Other age-related cataract: Secondary | ICD-10-CM | POA: Insufficient documentation

## 2014-01-14 DIAGNOSIS — E119 Type 2 diabetes mellitus without complications: Secondary | ICD-10-CM | POA: Insufficient documentation

## 2014-01-14 DIAGNOSIS — I10 Essential (primary) hypertension: Secondary | ICD-10-CM | POA: Insufficient documentation

## 2014-01-14 DIAGNOSIS — Z79899 Other long term (current) drug therapy: Secondary | ICD-10-CM | POA: Insufficient documentation

## 2014-01-14 DIAGNOSIS — F17200 Nicotine dependence, unspecified, uncomplicated: Secondary | ICD-10-CM | POA: Insufficient documentation

## 2014-01-14 HISTORY — PX: CATARACT EXTRACTION W/PHACO: SHX586

## 2014-01-14 LAB — GLUCOSE, CAPILLARY: Glucose-Capillary: 177 mg/dL — ABNORMAL HIGH (ref 70–99)

## 2014-01-14 SURGERY — PHACOEMULSIFICATION, CATARACT, WITH IOL INSERTION
Anesthesia: Monitor Anesthesia Care | Site: Eye | Laterality: Left

## 2014-01-14 MED ORDER — LIDOCAINE HCL (PF) 1 % IJ SOLN
INTRAMUSCULAR | Status: DC | PRN
  Administered 2014-01-14: .7 mL

## 2014-01-14 MED ORDER — LIDOCAINE HCL 3.5 % OP GEL: 1.0000 "application " | Freq: Once | OPHTHALMIC | Status: DC

## 2014-01-14 MED ORDER — TETRACAINE HCL 0.5 % OP SOLN
1.0000 [drp] | OPHTHALMIC | Status: AC
  Administered 2014-01-14 (×3): 1 [drp] via OPHTHALMIC

## 2014-01-14 MED ORDER — MIDAZOLAM HCL 5 MG/5ML IJ SOLN
INTRAMUSCULAR | Status: DC | PRN
  Administered 2014-01-14: 2 mg via INTRAVENOUS

## 2014-01-14 MED ORDER — MIDAZOLAM HCL 2 MG/2ML IJ SOLN
1.0000 mg | INTRAMUSCULAR | Status: DC | PRN
  Administered 2014-01-14: 2 mg via INTRAVENOUS

## 2014-01-14 MED ORDER — MIDAZOLAM HCL 2 MG/2ML IJ SOLN
INTRAMUSCULAR | Status: AC
  Filled 2014-01-14: qty 2

## 2014-01-14 MED ORDER — POVIDONE-IODINE 5 % OP SOLN
OPHTHALMIC | Status: DC | PRN
  Administered 2014-01-14: 1 via OPHTHALMIC

## 2014-01-14 MED ORDER — FENTANYL CITRATE 0.05 MG/ML IJ SOLN
INTRAMUSCULAR | Status: AC
  Filled 2014-01-14: qty 2

## 2014-01-14 MED ORDER — PROVISC 10 MG/ML IO SOLN
INTRAOCULAR | Status: DC | PRN
  Administered 2014-01-14: 0.85 mL via INTRAOCULAR

## 2014-01-14 MED ORDER — EPINEPHRINE HCL 1 MG/ML IJ SOLN
INTRAOCULAR | Status: DC | PRN
  Administered 2014-01-14: 10:00:00

## 2014-01-14 MED ORDER — CYCLOPENTOLATE-PHENYLEPHRINE 0.2-1 % OP SOLN
1.0000 [drp] | OPHTHALMIC | Status: AC
  Administered 2014-01-14 (×3): 1 [drp] via OPHTHALMIC

## 2014-01-14 MED ORDER — FENTANYL CITRATE 0.05 MG/ML IJ SOLN
25.0000 ug | INTRAMUSCULAR | Status: AC
  Administered 2014-01-14 (×2): 25 ug via INTRAVENOUS

## 2014-01-14 MED ORDER — LIDOCAINE 3.5 % OP GEL OPTIME - NO CHARGE
OPHTHALMIC | Status: DC | PRN
  Administered 2014-01-14: 2 [drp] via OPHTHALMIC

## 2014-01-14 MED ORDER — LACTATED RINGERS IV SOLN
INTRAVENOUS | Status: DC
  Administered 2014-01-14: 10:00:00 via INTRAVENOUS

## 2014-01-14 MED ORDER — NEOMYCIN-POLYMYXIN-DEXAMETH 3.5-10000-0.1 OP SUSP
OPHTHALMIC | Status: DC | PRN
  Administered 2014-01-14: 2 [drp] via OPHTHALMIC

## 2014-01-14 MED ORDER — EPINEPHRINE HCL 1 MG/ML IJ SOLN
INTRAMUSCULAR | Status: AC
  Filled 2014-01-14: qty 1

## 2014-01-14 MED ORDER — BSS IO SOLN
INTRAOCULAR | Status: DC | PRN
  Administered 2014-01-14: 15 mL via INTRAOCULAR

## 2014-01-14 MED ORDER — PHENYLEPHRINE HCL 2.5 % OP SOLN
1.0000 [drp] | OPHTHALMIC | Status: AC
  Administered 2014-01-14 (×3): 1 [drp] via OPHTHALMIC

## 2014-01-14 SURGICAL SUPPLY — 33 items
CAPSULAR TENSION RING-AMO (OPHTHALMIC RELATED) IMPLANT
CLOTH BEACON ORANGE TIMEOUT ST (SAFETY) ×3 IMPLANT
EYE SHIELD UNIVERSAL CLEAR (GAUZE/BANDAGES/DRESSINGS) ×3 IMPLANT
GLOVE BIO SURGEON STRL SZ 6.5 (GLOVE) IMPLANT
GLOVE BIO SURGEONS STRL SZ 6.5 (GLOVE)
GLOVE BIOGEL PI IND STRL 6.5 (GLOVE) IMPLANT
GLOVE BIOGEL PI IND STRL 7.0 (GLOVE) ×1 IMPLANT
GLOVE BIOGEL PI IND STRL 7.5 (GLOVE) IMPLANT
GLOVE BIOGEL PI INDICATOR 6.5 (GLOVE)
GLOVE BIOGEL PI INDICATOR 7.0 (GLOVE) ×2
GLOVE BIOGEL PI INDICATOR 7.5 (GLOVE)
GLOVE ECLIPSE 6.5 STRL STRAW (GLOVE) IMPLANT
GLOVE ECLIPSE 7.0 STRL STRAW (GLOVE) IMPLANT
GLOVE ECLIPSE 7.5 STRL STRAW (GLOVE) IMPLANT
GLOVE EXAM NITRILE LRG STRL (GLOVE) IMPLANT
GLOVE EXAM NITRILE MD LF STRL (GLOVE) ×3 IMPLANT
GLOVE SKINSENSE NS SZ6.5 (GLOVE)
GLOVE SKINSENSE NS SZ7.0 (GLOVE)
GLOVE SKINSENSE STRL SZ6.5 (GLOVE) IMPLANT
GLOVE SKINSENSE STRL SZ7.0 (GLOVE) IMPLANT
KIT VITRECTOMY (OPHTHALMIC RELATED) IMPLANT
PAD ARMBOARD 7.5X6 YLW CONV (MISCELLANEOUS) ×3 IMPLANT
PROC W NO LENS (INTRAOCULAR LENS)
PROC W SPEC LENS (INTRAOCULAR LENS)
PROCESS W NO LENS (INTRAOCULAR LENS) IMPLANT
PROCESS W SPEC LENS (INTRAOCULAR LENS) IMPLANT
RING MALYGIN (MISCELLANEOUS) IMPLANT
SIGHTPATH CAT PROC W REG LENS (Ophthalmic Related) ×3 IMPLANT
SYR TB 1ML LL NO SAFETY (SYRINGE) ×3 IMPLANT
TAPE SURG TRANSPORE 1 IN (GAUZE/BANDAGES/DRESSINGS) ×1 IMPLANT
TAPE SURGICAL TRANSPORE 1 IN (GAUZE/BANDAGES/DRESSINGS) ×2
VISCOELASTIC ADDITIONAL (OPHTHALMIC RELATED) IMPLANT
WATER STERILE IRR 250ML POUR (IV SOLUTION) ×3 IMPLANT

## 2014-01-14 NOTE — Op Note (Signed)
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  Assistants: None  Anesthesia: Topical with MAC  Indications: Painless, progressive loss of vision with compromise of daily activities.  Surgery: Cataract Extraction with Intraocular lens Implant Left Eye  Discription: The patient had dilating drops and viscous lidocaine placed into the left eye in the pre-op holding area. After transfer to the operating room, a time out was performed. The patient was then prepped and draped. Beginning with a 75 degree blade a paracentesis port was made at the surgeon's 2 o'clock position. The anterior chamber was then filled with 1% non-preserved lidocaine. This was followed by filling the anterior chamber with Provisc. A 2.594mm keratome blade was used to make a clear corneal incision at the temporal limbus. A bent cystatome needle was used to create a continuous tear capsulotomy. Hydrodissection was performed with balanced salt solution on a Fine canula. The lens nucleus was then removed using the phacoemulsification handpiece. Residual cortex was removed with the I&A handpiece. The anterior chamber and capsular bag were refilled with Provisc. A posterior chamber intraocular lens was placed into the capsular bag with it's injector. The implant was positioned with the Kuglan hook. The Provisc was then removed from the anterior chamber and capsular bag with the I&A handpiece. Stromal hydration of the main incision and paracentesis port was performed with BSS on a Fine canula. The wounds were tested for leak which was negative. The patient tolerated the procedure well. There were no operative complications. The patient was then transferred to the recovery room in stable condition.  Complications: None  Specimen: None  EBL: None  Prosthetic device: B&L enVista, MX60, power 20.5D, SN  [id number].

## 2014-01-14 NOTE — Anesthesia Postprocedure Evaluation (Signed)
  Anesthesia Post-op Note  Patient: Alan Watson  Procedure(s) Performed: Procedure(s) with comments: CATARACT EXTRACTION PHACO AND INTRAOCULAR LENS PLACEMENT (IOC) (Left) - CDE:11.90  Patient Location: Short Stay  Anesthesia Type:MAC  Level of Consciousness: awake, alert , oriented and patient cooperative  Airway and Oxygen Therapy: Patient Spontanous Breathing  Post-op Pain: none  Post-op Assessment: Post-op Vital signs reviewed, Patient's Cardiovascular Status Stable, Respiratory Function Stable, Patent Airway and Pain level controlled  Post-op Vital Signs: Reviewed and stable  Complications: No apparent anesthesia complications

## 2014-01-14 NOTE — Transfer of Care (Signed)
Immediate Anesthesia Transfer of Care Note  Patient: Alan Watson  Procedure(s) Performed: Procedure(s) with comments: CATARACT EXTRACTION PHACO AND INTRAOCULAR LENS PLACEMENT (IOC) (Left) - CDE:11.90  Patient Location: Short Stay  Anesthesia Type:MAC  Level of Consciousness: awake, alert , oriented and patient cooperative  Airway & Oxygen Therapy: Patient Spontanous Breathing  Post-op Assessment: Report given to PACU RN, Post -op Vital signs reviewed and stable and Patient moving all extremities  Post vital signs: Reviewed and stable  Complications: No apparent anesthesia complications

## 2014-01-14 NOTE — H&P (Signed)
I have reviewed the H&P, the patient was re-examined, and I have identified no interval changes in medical condition and plan of care since the history and physical of record  

## 2014-01-14 NOTE — Discharge Instructions (Signed)
General Anesthesia, Adult, Care After °Refer to this sheet in the next few weeks. These instructions provide you with information on caring for yourself after your procedure. Your health care provider may also give you more specific instructions. Your treatment has been planned according to current medical practices, but problems sometimes occur. Call your health care provider if you have any problems or questions after your procedure. °WHAT TO EXPECT AFTER THE PROCEDURE °After the procedure, it is typical to experience: °· Sleepiness. °· Nausea and vomiting. °HOME CARE INSTRUCTIONS °· For the first 24 hours after general anesthesia: °· Have a responsible person with you. °· Do not drive a car. If you are alone, do not take public transportation. °· Do not drink alcohol. °· Do not take medicine that has not been prescribed by your health care provider. °· Do not sign important papers or make important decisions. °· You may resume a normal diet and activities as directed by your health care provider. °· Change bandages (dressings) as directed. °· If you have questions or problems that seem related to general anesthesia, call the hospital and ask for the anesthetist or anesthesiologist on call. °SEEK MEDICAL CARE IF: °· You have nausea and vomiting that continue the day after anesthesia. °· You develop a rash. °SEEK IMMEDIATE MEDICAL CARE IF:  °· You have difficulty breathing. °· You have chest pain. °· You have any allergic problems. °Document Released: 03/04/2001 Document Revised: 07/29/2013 Document Reviewed: 06/11/2013 °Exit[employer]® Patient Information ©2014 [employer]. ° ° ° °

## 2014-01-14 NOTE — Anesthesia Preprocedure Evaluation (Signed)
Anesthesia Evaluation  Patient identified by MRN, date of birth, ID band Patient awake    Reviewed: Allergy & Precautions, H&P , NPO status , Patient's Chart, lab work & pertinent test results  Airway Mallampati: II TM Distance: >3 FB     Dental  (+) Teeth Intact   Pulmonary Current Smoker,  breath sounds clear to auscultation        Cardiovascular hypertension, Pt. on medications Rhythm:Regular Rate:Normal     Neuro/Psych    GI/Hepatic   Endo/Other  diabetes, Type 2, Oral Hypoglycemic Agents  Renal/GU      Musculoskeletal   Abdominal   Peds  Hematology   Anesthesia Other Findings   Reproductive/Obstetrics                           Anesthesia Physical Anesthesia Plan  ASA: III  Anesthesia Plan: MAC   Post-op Pain Management:    Induction: Intravenous  Airway Management Planned: Nasal Cannula  Additional Equipment:   Intra-op Plan:   Post-operative Plan:   Informed Consent: I have reviewed the patients History and Physical, chart, labs and discussed the procedure including the risks, benefits and alternatives for the proposed anesthesia with the patient or authorized representative who has indicated his/her understanding and acceptance.     Plan Discussed with:   Anesthesia Plan Comments:         Anesthesia Quick Evaluation

## 2014-01-18 ENCOUNTER — Encounter (HOSPITAL_COMMUNITY): Payer: Self-pay | Admitting: Ophthalmology

## 2016-04-18 ENCOUNTER — Ambulatory Visit (INDEPENDENT_AMBULATORY_CARE_PROVIDER_SITE_OTHER): Payer: Medicare FFS | Admitting: Urology

## 2016-04-18 DIAGNOSIS — R972 Elevated prostate specific antigen [PSA]: Secondary | ICD-10-CM

## 2016-10-24 ENCOUNTER — Ambulatory Visit: Payer: Medicare FFS | Admitting: Urology

## 2021-03-15 NOTE — Progress Notes (Signed)
 Patients creatinine is 1.41 and GFR is 47, stable stage III kidney disease.

## 2024-11-25 ENCOUNTER — Other Ambulatory Visit: Payer: Self-pay

## 2024-11-25 ENCOUNTER — Encounter (HOSPITAL_COMMUNITY): Payer: Self-pay

## 2024-11-25 ENCOUNTER — Observation Stay (HOSPITAL_COMMUNITY)
Admission: EM | Admit: 2024-11-25 | Discharge: 2024-11-26 | Disposition: A | Discharge status: Home / Self Care | Attending: Internal Medicine | Admitting: Internal Medicine

## 2024-11-25 ENCOUNTER — Emergency Department (HOSPITAL_COMMUNITY)

## 2024-11-25 DIAGNOSIS — W19XXXA Unspecified fall, initial encounter: Secondary | ICD-10-CM | POA: Insufficient documentation

## 2024-11-25 DIAGNOSIS — R7989 Other specified abnormal findings of blood chemistry: Secondary | ICD-10-CM | POA: Insufficient documentation

## 2024-11-25 DIAGNOSIS — F1090 Alcohol use, unspecified, uncomplicated: Secondary | ICD-10-CM | POA: Insufficient documentation

## 2024-11-25 DIAGNOSIS — Z79899 Other long term (current) drug therapy: Secondary | ICD-10-CM | POA: Diagnosis not present

## 2024-11-25 DIAGNOSIS — F17210 Nicotine dependence, cigarettes, uncomplicated: Secondary | ICD-10-CM | POA: Insufficient documentation

## 2024-11-25 DIAGNOSIS — E1165 Type 2 diabetes mellitus with hyperglycemia: Secondary | ICD-10-CM | POA: Diagnosis not present

## 2024-11-25 DIAGNOSIS — E1122 Type 2 diabetes mellitus with diabetic chronic kidney disease: Secondary | ICD-10-CM | POA: Insufficient documentation

## 2024-11-25 DIAGNOSIS — I129 Hypertensive chronic kidney disease with stage 1 through stage 4 chronic kidney disease, or unspecified chronic kidney disease: Secondary | ICD-10-CM | POA: Diagnosis not present

## 2024-11-25 DIAGNOSIS — Y92096 Garden or yard of other non-institutional residence as the place of occurrence of the external cause: Secondary | ICD-10-CM | POA: Diagnosis not present

## 2024-11-25 DIAGNOSIS — R739 Hyperglycemia, unspecified: Principal | ICD-10-CM

## 2024-11-25 DIAGNOSIS — H539 Unspecified visual disturbance: Secondary | ICD-10-CM | POA: Insufficient documentation

## 2024-11-25 DIAGNOSIS — H1131 Conjunctival hemorrhage, right eye: Secondary | ICD-10-CM | POA: Insufficient documentation

## 2024-11-25 DIAGNOSIS — S0990XA Unspecified injury of head, initial encounter: Secondary | ICD-10-CM | POA: Insufficient documentation

## 2024-11-25 DIAGNOSIS — N179 Acute kidney failure, unspecified: Secondary | ICD-10-CM | POA: Diagnosis not present

## 2024-11-25 DIAGNOSIS — N1832 Chronic kidney disease, stage 3b: Secondary | ICD-10-CM | POA: Diagnosis not present

## 2024-11-25 LAB — URINALYSIS, ROUTINE W REFLEX MICROSCOPIC
Bacteria, UA: NONE SEEN
Bilirubin Urine: NEGATIVE
Glucose, UA: >=500 mg/dL — AB
Hgb urine dipstick: NEGATIVE
Ketones, ur: NEGATIVE mg/dL
Leukocytes,Ua: NEGATIVE
Nitrite: NEGATIVE
Protein, ur: NEGATIVE mg/dL
Specific Gravity, Urine: 1.02 (ref 1.005–1.030)
pH: 6 (ref 5.0–8.0)

## 2024-11-25 LAB — CBC WITH DIFFERENTIAL/PLATELET
Abs Immature Granulocytes: 0.01 K/uL (ref 0.00–0.07)
Basophils Absolute: 0.1 K/uL (ref 0.0–0.1)
Basophils Relative: 1 %
Eosinophils Absolute: 0.2 K/uL (ref 0.0–0.5)
Eosinophils Relative: 3 %
HCT: 37.4 % — ABNORMAL LOW (ref 39.0–52.0)
Hemoglobin: 12.5 g/dL — ABNORMAL LOW (ref 13.0–17.0)
Immature Granulocytes: 0 %
Lymphocytes Relative: 24 %
Lymphs Abs: 1.6 K/uL (ref 0.7–4.0)
MCH: 32.3 pg (ref 26.0–34.0)
MCHC: 33.4 g/dL (ref 30.0–36.0)
MCV: 96.6 fL (ref 80.0–100.0)
Monocytes Absolute: 0.9 K/uL (ref 0.1–1.0)
Monocytes Relative: 12 %
Neutro Abs: 4.1 K/uL (ref 1.7–7.7)
Neutrophils Relative %: 60 %
Platelets: 272 K/uL (ref 150–400)
RBC: 3.87 MIL/uL — ABNORMAL LOW (ref 4.22–5.81)
RDW: 11.9 % (ref 11.5–15.5)
WBC: 6.9 K/uL (ref 4.0–10.5)
nRBC: 0 % (ref 0.0–0.2)

## 2024-11-25 LAB — COMPREHENSIVE METABOLIC PANEL WITH GFR
ALT: 15 U/L (ref 0–44)
AST: 22 U/L (ref 15–41)
Albumin: 4.2 g/dL (ref 3.5–5.0)
Alkaline Phosphatase: 141 U/L — ABNORMAL HIGH (ref 38–126)
Anion gap: 15 (ref 5–15)
BUN: 19 mg/dL (ref 8–23)
CO2: 23 mmol/L (ref 22–32)
Calcium: 9.5 mg/dL (ref 8.9–10.3)
Chloride: 86 mmol/L — ABNORMAL LOW (ref 98–111)
Creatinine, Ser: 2.3 mg/dL — ABNORMAL HIGH (ref 0.61–1.24)
GFR, Estimated: 27 mL/min — ABNORMAL LOW (ref >60)
Glucose, Bld: 820 mg/dL (ref 70–99)
Potassium: 4.4 mmol/L (ref 3.5–5.1)
Sodium: 124 mmol/L — ABNORMAL LOW (ref 135–145)
Total Bilirubin: 0.6 mg/dL (ref 0.0–1.2)
Total Protein: 6.8 g/dL (ref 6.5–8.1)

## 2024-11-25 LAB — GLUCOSE, CAPILLARY
Glucose-Capillary: 110 mg/dL — ABNORMAL HIGH (ref 70–99)
Glucose-Capillary: 116 mg/dL — ABNORMAL HIGH (ref 70–99)
Glucose-Capillary: 125 mg/dL — ABNORMAL HIGH (ref 70–99)
Glucose-Capillary: 133 mg/dL — ABNORMAL HIGH (ref 70–99)
Glucose-Capillary: 182 mg/dL — ABNORMAL HIGH (ref 70–99)

## 2024-11-25 LAB — BLOOD GAS, VENOUS
Acid-Base Excess: 4.5 mmol/L — ABNORMAL HIGH (ref 0.0–2.0)
Bicarbonate: 30.4 mmol/L — ABNORMAL HIGH (ref 20.0–28.0)
Drawn by: 69260
O2 Saturation: 56.4 %
Patient temperature: 36.7
pCO2, Ven: 48 mmHg (ref 44–60)
pH, Ven: 7.4 (ref 7.25–7.43)
pO2, Ven: <31 mmHg — CL (ref 32–45)

## 2024-11-25 LAB — BASIC METABOLIC PANEL WITH GFR
Anion gap: 13 (ref 5–15)
BUN: 15 mg/dL (ref 8–23)
CO2: 24 mmol/L (ref 22–32)
Calcium: 9.4 mg/dL (ref 8.9–10.3)
Chloride: 101 mmol/L (ref 98–111)
Creatinine, Ser: 1.87 mg/dL — ABNORMAL HIGH (ref 0.61–1.24)
GFR, Estimated: 35 mL/min — ABNORMAL LOW (ref >60)
Glucose, Bld: 103 mg/dL — ABNORMAL HIGH (ref 70–99)
Potassium: 3.5 mmol/L (ref 3.5–5.1)
Sodium: 137 mmol/L (ref 135–145)

## 2024-11-25 LAB — MAGNESIUM: Magnesium: 1.8 mg/dL (ref 1.7–2.4)

## 2024-11-25 LAB — CBG MONITORING, ED
Glucose-Capillary: >600 mg/dL (ref 70–99)
Glucose-Capillary: 487 mg/dL — ABNORMAL HIGH (ref 70–99)
Glucose-Capillary: 342 mg/dL — ABNORMAL HIGH (ref 70–99)
Glucose-Capillary: 452 mg/dL — ABNORMAL HIGH (ref 70–99)

## 2024-11-25 LAB — MRSA NEXT GEN BY PCR, NASAL: MRSA by PCR Next Gen: NOT DETECTED

## 2024-11-25 LAB — BETA-HYDROXYBUTYRIC ACID: Beta-Hydroxybutyric Acid: 0.11 mmol/L (ref 0.05–0.27)

## 2024-11-25 MED ORDER — INSULIN ASPART 100 UNIT/ML IJ SOLN
0.0000 [IU] | Freq: Three times a day (TID) | INTRAMUSCULAR | Status: DC
  Administered 2024-11-26: 12:00:00 10 [IU] via SUBCUTANEOUS
  Administered 2024-11-26: 08:00:00 3 [IU] via SUBCUTANEOUS
  Filled 2024-11-25 (×3): qty 1

## 2024-11-25 MED ORDER — INSULIN ASPART 100 UNIT/ML IJ SOLN
10.0000 [IU] | Freq: Once | INTRAMUSCULAR | Status: AC
  Administered 2024-11-25: 16:00:00 10 [IU] via SUBCUTANEOUS
  Filled 2024-11-25: qty 1

## 2024-11-25 MED ORDER — DEXTROSE 50 % IV SOLN: 0.0000 mL | INTRAVENOUS | Status: DC | PRN

## 2024-11-25 MED ORDER — GLIPIZIDE 5 MG PO TABS
2.5000 mg | ORAL_TABLET | Freq: Two times a day (BID) | ORAL | Status: DC
  Administered 2024-11-25 – 2024-11-26 (×2): 2.5 mg via ORAL
  Filled 2024-11-25 (×2): qty 1

## 2024-11-25 MED ORDER — INSULIN ASPART 100 UNIT/ML IJ SOLN: 0.0000 [IU] | Freq: Every day | INTRAMUSCULAR | Status: DC

## 2024-11-25 MED ORDER — ACETAMINOPHEN 325 MG PO TABS: 650.0000 mg | ORAL_TABLET | Freq: Four times a day (QID) | ORAL | Status: DC | PRN

## 2024-11-25 MED ORDER — SODIUM CHLORIDE 0.9 % IV BOLUS
1000.0000 mL | Freq: Once | INTRAVENOUS | Status: AC
  Administered 2024-11-25: 16:00:00 1000 mL via INTRAVENOUS

## 2024-11-25 MED ORDER — LACTATED RINGERS IV SOLN: INTRAVENOUS | Status: DC

## 2024-11-25 MED ORDER — NICOTINE 21 MG/24HR TD PT24
21.0000 mg | MEDICATED_PATCH | Freq: Every day | TRANSDERMAL | Status: DC
  Administered 2024-11-25 – 2024-11-26 (×2): 21 mg via TRANSDERMAL
  Filled 2024-11-25 (×2): qty 1

## 2024-11-25 MED ORDER — DEXTROSE IN LACTATED RINGERS 5 % IV SOLN: INTRAVENOUS | Status: DC

## 2024-11-25 MED ORDER — INSULIN GLARGINE 100 UNIT/ML ~~LOC~~ SOLN
5.0000 [IU] | Freq: Every day | SUBCUTANEOUS | Status: DC
  Administered 2024-11-25: 5 [IU] via SUBCUTANEOUS
  Filled 2024-11-25 (×2): qty 0.05

## 2024-11-25 MED ORDER — LACTATED RINGERS IV BOLUS
1000.0000 mL | Freq: Once | INTRAVENOUS | Status: AC
  Administered 2024-11-25: 20:00:00 1000 mL via INTRAVENOUS

## 2024-11-25 MED ORDER — CHLORHEXIDINE GLUCONATE CLOTH 2 % EX PADS
6.0000 | MEDICATED_PAD | Freq: Every day | CUTANEOUS | Status: DC
  Administered 2024-11-26: 05:00:00 6 via TOPICAL

## 2024-11-25 MED ORDER — INSULIN REGULAR(HUMAN) IN NACL 100-0.9 UT/100ML-% IV SOLN
INTRAVENOUS | Status: DC
  Administered 2024-11-25: 17:00:00 7.5 [IU]/h via INTRAVENOUS
  Filled 2024-11-25: qty 100

## 2024-11-25 MED ORDER — ACETAMINOPHEN 650 MG RE SUPP: 650.0000 mg | Freq: Four times a day (QID) | RECTAL | Status: DC | PRN

## 2024-11-25 MED ORDER — ALBUTEROL SULFATE (2.5 MG/3ML) 0.083% IN NEBU: 2.5000 mg | INHALATION_SOLUTION | RESPIRATORY_TRACT | Status: DC | PRN

## 2024-11-25 MED ORDER — HYDRALAZINE HCL 20 MG/ML IJ SOLN: 5.0000 mg | INTRAMUSCULAR | Status: DC | PRN

## 2024-11-25 MED ORDER — METFORMIN HCL 850 MG PO TABS: 850.0000 mg | ORAL_TABLET | Freq: Two times a day (BID) | ORAL | Status: DC

## 2024-11-25 MED ORDER — POTASSIUM CHLORIDE CRYS ER 20 MEQ PO TBCR
40.0000 meq | EXTENDED_RELEASE_TABLET | Freq: Once | ORAL | Status: AC
  Administered 2024-11-25: 40 meq via ORAL
  Filled 2024-11-25: qty 2

## 2024-11-25 NOTE — H&P (Signed)
 [facility] H&P   Patient Demographics:    Alan Watson, is a 83 y.o. male  MRN: [id number]   DOB - 1941-09-13  Admit Date - 11/25/2024  Outpatient Primary MD for the patient is Katrinka Aquas, MD  Chief Complaint  Patient presents with   Hyperglycemia      HPI:    Alan Watson  is a 83 y.o. male, with past medical history of hypertension, type 2 diabetes, CKD stage IIIb, tobacco use, patient presents to ED secondary to concern of hyperglycemia, he reports polydipsia, polyuria, blurry vision, and his blood sugar uncontrolled, frequent changes of his oral regimen recently, glipizide  was discontinued in August due to hyperglycemia and A1c of 5.6, Jardiance was dispensed but he could not afford so it was never received, and he was recently started on glimepiride 1 mg, dose uptitrated due to uncontrolled hyperglycemia, reports weakness, had a fall today, no loss of consciousness, reports he hit his right forehead and eye on the grass, chest pain, no shortness of breath. - ED his workup significant for glucose 820, but no DKA, as bicarb within normal limit, anion gap normal as well, with pH of 7.4, labs significant for glucosuria but no ketonuria, creatinine up to 2.3, baseline 1.7 on [facility] this year, [facility] consulted to admit    Review of systems:      A full 10 point Review of Systems was done, except as stated above, all other Review of Systems were negative.   With Past History of the following :    Past Medical History:  Diagnosis Date   Diabetes mellitus without complication (HCC)    Hypertension       Past Surgical History:  Procedure Laterality Date   CATARACT EXTRACTION W/PHACO Right 01/04/2014   Procedure: CATARACT EXTRACTION PHACO AND INTRAOCULAR LENS PLACEMENT (IOC);  Surgeon: Cherene Mania, MD;  Location: [facility];  Service: Ophthalmology;   Laterality: Right;  CDE:  8.07   CATARACT EXTRACTION W/PHACO Left 01/14/2014   Procedure: CATARACT EXTRACTION PHACO AND INTRAOCULAR LENS PLACEMENT (IOC);  Surgeon: Cherene Mania, MD;  Location: [facility];  Service: Ophthalmology;  Laterality: Left;  CDE:11.90      Social History:     Social History   Tobacco Use   Smoking status: Every Day    Current packs/day: 1.00    Average packs/day: 1 pack/day for 50.0 years (50.0 ttl pk-yrs)    Types: Cigarettes    Passive exposure: Current   Smokeless tobacco: Not on file  Substance Use Topics   Alcohol use: Yes    Alcohol/week: 1.0 standard drink of alcohol    Types: 1 Shots of liquor per week       Family History :    History reviewed. No pertinent family history.    Home Medications:   Prior to Admission medications  Medication Sig Start Date End Date Taking?  Authorizing Provider  amLODipine (NORVASC) 10 MG tablet Take 10 mg by mouth daily.    [provider]  aspirin EC 81 MG tablet Take 81 mg by mouth daily.    [provider]  glipiZIDE  (GLUCOTROL ) 5 MG tablet Take 5 mg by mouth 2 (two) times daily before a meal.    [provider]  lisinopril-hydrochlorothiazide (PRINZIDE,ZESTORETIC) 20-12.5 MG per tablet Take 2 tablets by mouth daily.    [provider]     Allergies:    Allergies[1]   Physical Exam:   Vitals  Blood pressure (!) 132/54, pulse (!) 54, temperature 97.6 F (36.4 C), temperature source Oral, resp. rate 20, height 5' 5 (1.651 m), weight 59.8 kg, SpO2 96%.   1. General Well male, laying in bed, no apparent distress  2. Normal affect and insight, Not Suicidal or Homicidal, Awake Alert, Oriented X 3.  3. No F.N deficits, ALL C.Nerves Intact, Strength 5/5 all 4 extremities, Sensation intact all 4 extremities, Plantars down going.  4. Ears and Eyes appear Normal, subconjunctival hemorrhage, dry oral Mucosa.  5. Supple Neck, No JVD, No cervical lymphadenopathy appriciated,  No Carotid Bruits.  6. Symmetrical Chest wall movement, Good air movement bilaterally, CTAB.  7. RRR, No Gallops, Rubs or Murmurs, No Parasternal Heave.  8. Positive Bowel Sounds, Abdomen Soft, No tenderness, No organomegaly appriciated,No rebound -guarding or rigidity.  9.  No Cyanosis, Normal Skin Turgor, No Skin Rash or Bruise.  10. Good muscle tone,  joints appear normal , no effusions, Normal ROM.    Data Review:    CBC Recent Labs  Lab 11/25/24 1404  WBC 6.9  HGB 12.5*  HCT 37.4*  PLT 272  MCV 96.6  MCH 32.3  MCHC 33.4  RDW 11.9  LYMPHSABS 1.6  MONOABS 0.9  EOSABS 0.2  BASOSABS 0.1   ------------------------------------------------------------------------------------------------------------------  Chemistries  Recent Labs  Lab 11/25/24 1404  NA 124*  K 4.4  CL 86*  CO2 23  GLUCOSE 820*  BUN 19  CREATININE 2.30*  CALCIUM 9.5  MG 1.8  AST 22  ALT 15  ALKPHOS 141*  BILITOT 0.6   ------------------------------------------------------------------------------------------------------------------ estimated creatinine clearance is 20.6 mL/min (A) (by C-G formula based on SCr of 2.3 mg/dL (H)). ------------------------------------------------------------------------------------------------------------------ No results for input(s): TSH, T4TOTAL, T3FREE, THYROIDAB in the last 72 hours.  Invalid input(s): FREET3  Coagulation profile No results for input(s): INR, PROTIME in the last 168 hours. ------------------------------------------------------------------------------------------------------------------- No results for input(s): DDIMER in the last 72 hours. -------------------------------------------------------------------------------------------------------------------  Cardiac Enzymes No results for input(s): CKMB, TROPONINI, MYOGLOBIN in the last 168 hours.  Invalid input(s):  CK ------------------------------------------------------------------------------------------------------------------ No results found for: BNP   ---------------------------------------------------------------------------------------------------------------  Urinalysis    Component Value Date/Time   COLORURINE STRAW (A) 11/25/2024 1407   APPEARANCEUR CLEAR 11/25/2024 1407   LABSPEC 1.020 11/25/2024 1407   PHURINE 6.0 11/25/2024 1407   GLUCOSEU >=500 (A) 11/25/2024 1407   HGBUR NEGATIVE 11/25/2024 1407   BILIRUBINUR NEGATIVE 11/25/2024 1407   KETONESUR NEGATIVE 11/25/2024 1407   PROTEINUR NEGATIVE 11/25/2024 1407   NITRITE NEGATIVE 11/25/2024 1407   LEUKOCYTESUR NEGATIVE 11/25/2024 1407    ----------------------------------------------------------------------------------------------------------------   Imaging Results:    CT Head Wo Contrast Result Date: 11/25/2024 EXAM: CT HEAD WITHOUT CONTRAST 11/25/2024 03:25:45 PM TECHNIQUE: CT of the head was performed without the administration of intravenous contrast. Automated exposure control, iterative reconstruction, and/or weight based adjustment of the mA/kV was utilized to reduce the radiation dose to as low as reasonably achievable.  COMPARISON: None available. CLINICAL HISTORY: Head trauma, minor (Age >= 65y) FINDINGS: BRAIN AND VENTRICLES: No acute hemorrhage. Scattered white matter hypodensities which are nonspecific but most commonly represent chronic microvascular ischemic changes. Nonspecific coarse calcification left parietal region. No evidence of acute infarct. No hydrocephalus. No extra-axial collection. No mass effect or midline shift. ORBITS: There is prominent orbital fat and mild bilateral orbital proptosis. SINUSES: No acute abnormality. Mild right mastoid effusion. SOFT TISSUES AND SKULL: No acute soft tissue abnormality. No skull fracture. IMPRESSION: 1. No acute intracranial hemorrhage or calvarial fracture.  Electronically signed by: Prentice Spade MD 11/25/2024 04:03 PM EST RP Workstation: [id number]      Assessment & Plan:    Principal Problem:   AKI (acute kidney injury) Active Problems:   Uncontrolled type 2 diabetes mellitus with hyperglycemia, without long-term current use of insulin  (HCC)    Diabetes mellitus, type II, uncontrolled with hyperglycemia -With known history of diabetes mellitus, controlled with oral agent, with recent multiple changes for his oral regimen due to different reasons. - A1c 5.8 on 8//2025, up to 9.6 on 10/19/2024 - Patient had frequent adjustment of his medication, glipizide  was discontinued 8/25 due to hypoglycemia, lower A1c, then supposed to start glimepiride and Jardiance, Jardiance was never started as he could not afford it, and glimepiride just started last week with recommendation to uptitrate to 5 mg over the last 2 days due to hyperglycemia. - He is not in DKA on presentation but severe hyperglycemia with glucose of 825-started on insulin  drip, will start on oral agent hoping to stop his insulin  drip soon, will start on metformin  after repeating creatinine and low-dose glipizide . - He does not want to start insulin  as an outpatient, which I think should be appropriate given his A1c was controlled just on glipizide  in August.  Hypertension - Blood pressure was soft on presentation, so we will hold meds for now and resume when stable  Pseudohyponatremia - Resolved once hyperglycemia is corrected  CKD stage IIIb - Baseline creatinine 1.7 reviewing labs from [facility] currently elevated at 2.3. - Continue with IV fluids  Tobacco use -Counseled, started on nicotine  patch -    DVT Prophylaxis Heparin  AM Labs Ordered, also please review Full Orders  Family Communication: Admission, patients condition and plan of care including tests being ordered have been discussed with the patient and cousin at bedside who indicate understanding and agree  with the plan and Code Status.  Code Status full code  Likely DC to home  Consults called: None  Admission status: Inpatient  Time spent in minutes : 70 minutes   Brayton Lye M.D on 11/25/2024 at 7:55 PM   [facility] - Office  [phone]        [1]  Allergies Allergen Reactions   Lisinopril     Angioedema

## 2024-11-25 NOTE — ED Notes (Signed)
 Lab at bedside

## 2024-11-25 NOTE — ED Provider Notes (Cosign Needed)
 [facility] EMERGENCY DEPARTMENT AT [facility] Provider Note   CSN: [id number] Arrival date & time: 11/25/24  1258     Patient presents with: Hyperglycemia   Alan Watson is a 83 y.o. male with a history of hypertension and type 2 diabetes presenting for evaluation of hyperglycemia.  He states for the past week he has been having increased thirst, increased urination and has noticed his vision has been blurry than normal which usually are signs his blood sugars are not under good control.  He reach out to his PCP who increased his glipizide  from 1 mg to 5 mg which he started this increased dose yesterday.  Today he was in his yard and bent over to pick up some trash and when he tried to get up he fell in the grass, striking his right forehead and eye on the grass.  He has no pain at this site but has noticed a red spot in his right eye since the event. Denies foreign body sensation.  He has no worsened blurred vision but as mentioned his vision has been blurry for the past week.  He had no LOC, he denies headache.  He denies fevers chills, chest pain, abdominal pain, nausea or vomiting.  Denies dysuria.   The history is provided by the patient.       Prior to Admission medications  Medication Sig Start Date End Date Taking? Authorizing Provider  amLODipine (NORVASC) 10 MG tablet Take 10 mg by mouth daily.    [provider]  aspirin EC 81 MG tablet Take 81 mg by mouth daily.    [provider]  glipiZIDE  (GLUCOTROL ) 5 MG tablet Take 5 mg by mouth 2 (two) times daily before a meal.    [provider]  lisinopril-hydrochlorothiazide (PRINZIDE,ZESTORETIC) 20-12.5 MG per tablet Take 2 tablets by mouth daily.    [provider]    Allergies: Lisinopril    Review of Systems  Constitutional:  Negative for chills and fever.  HENT:  Negative for congestion.   Eyes:  Positive for visual disturbance.  Respiratory:  Negative for chest tightness  and shortness of breath.   Cardiovascular:  Negative for chest pain.  Gastrointestinal:  Negative for abdominal pain, nausea and vomiting.  Endocrine: Positive for polydipsia and polyuria.  Genitourinary: Negative.   Musculoskeletal:  Negative for arthralgias, joint swelling and neck pain.  Skin: Negative.  Negative for rash and wound.  Neurological:  Negative for dizziness, weakness, light-headedness, numbness and headaches.  Psychiatric/Behavioral: Negative.      Updated Vital Signs BP (!) 158/67   Pulse (!) 57   Temp 98.1 F (36.7 C)   Resp (!) 22   Ht 5' 6 (1.676 m)   Wt 60.3 kg   SpO2 94%   BMI 21.47 kg/m   Physical Exam Vitals and nursing note reviewed.  Constitutional:      Appearance: He is well-developed.  HENT:     Head: Normocephalic.     Comments: Conjunctival hemorrhage noted right eye at the 9 o'clock position.  There are no other obvious trauma on exam.  He has no pain with palpation around his right orbital rim.    Mouth/Throat:     Mouth: Mucous membranes are dry.  Eyes:     Conjunctiva/sclera: Conjunctivae normal.  Cardiovascular:     Rate and Rhythm: Normal rate and regular rhythm.     Heart sounds: Normal heart sounds.  Pulmonary:     Effort: Pulmonary effort is  normal.     Breath sounds: Normal breath sounds. No wheezing.  Abdominal:     General: Bowel sounds are normal. There is no distension.     Palpations: Abdomen is soft.     Tenderness: There is no abdominal tenderness. There is no guarding.  Musculoskeletal:        General: Normal range of motion.     Cervical back: Normal range of motion.  Skin:    General: Skin is warm and dry.  Neurological:     General: No focal deficit present.     Mental Status: He is alert.     (all labs ordered are listed, but only abnormal results are displayed) Labs Reviewed  URINALYSIS, ROUTINE W REFLEX MICROSCOPIC - Abnormal; Notable for the following components:      Result Value   Color, Urine STRAW  (*)    Glucose, UA >=500 (*)    All other components within normal limits  CBC WITH DIFFERENTIAL/PLATELET - Abnormal; Notable for the following components:   RBC 3.87 (*)    Hemoglobin 12.5 (*)    HCT 37.4 (*)    All other components within normal limits  COMPREHENSIVE METABOLIC PANEL WITH GFR - Abnormal; Notable for the following components:   Sodium 124 (*)    Chloride 86 (*)    Glucose, Bld 820 (*)    Creatinine, Ser 2.30 (*)    Alkaline Phosphatase 141 (*)    GFR, Estimated 27 (*)    All other components within normal limits  BLOOD GAS, VENOUS - Abnormal; Notable for the following components:   pO2, Ven <31 (*)    Bicarbonate 30.4 (*)    Acid-Base Excess 4.5 (*)    All other components within normal limits  CBG MONITORING, ED - Abnormal; Notable for the following components:   Glucose-Capillary >600 (*)    All other components within normal limits  MAGNESIUM  BETA-HYDROXYBUTYRIC ACID    EKG: None  Radiology: CT Head Wo Contrast Result Date: 11/25/2024 EXAM: CT HEAD WITHOUT CONTRAST 11/25/2024 03:25:45 PM TECHNIQUE: CT of the head was performed without the administration of intravenous contrast. Automated exposure control, iterative reconstruction, and/or weight based adjustment of the mA/kV was utilized to reduce the radiation dose to as low as reasonably achievable. COMPARISON: None available. CLINICAL HISTORY: Head trauma, minor (Age >= 65y) FINDINGS: BRAIN AND VENTRICLES: No acute hemorrhage. Scattered white matter hypodensities which are nonspecific but most commonly represent chronic microvascular ischemic changes. Nonspecific coarse calcification left parietal region. No evidence of acute infarct. No hydrocephalus. No extra-axial collection. No mass effect or midline shift. ORBITS: There is prominent orbital fat and mild bilateral orbital proptosis. SINUSES: No acute abnormality. Mild right mastoid effusion. SOFT TISSUES AND SKULL: No acute soft tissue abnormality. No  skull fracture. IMPRESSION: 1. No acute intracranial hemorrhage or calvarial fracture. Electronically signed by: Prentice Spade MD 11/25/2024 04:03 PM EST RP Workstation: [id number]     Procedures   Medications Ordered in the ED  insulin  regular, human (MYXREDLIN ) 100 units/ 100 mL infusion (has no administration in time range)  lactated ringers  infusion (has no administration in time range)  dextrose  5 % in lactated ringers  infusion (has no administration in time range)  dextrose  50 % solution 0-50 mL (has no administration in time range)  sodium chloride  0.9 % bolus 1,000 mL (1,000 mLs Intravenous New Bag/Given 11/25/24 1533)  insulin  aspart (novoLOG ) injection 10 Units (10 Units Subcutaneous Given 11/25/24 1530)  Medical Decision Making Patient presenting with worsening hyperglycemia, blurred vision, polydipsia and polyuria for the past week, recently increased his glimepiride from 1 mg to 5 mg per wife at bedside 2 days ago.  Had been on Jardiance but discontinued last month for unclear reason.  Denies abdominal pain, nausea or vomiting, no symptoms to suggest DKA.  Clemens this morning when he bent over outdoors, hit his right forehead and eye on the grass, denies any pain at the site but does have some edema.  Imaging is reassuring, labs as outlined below suggesting significant hyperglycemia without DKA.  He was given IV fluids along with subcu insulin  and aspart, once labs fully resulted added Endo tool protocol.  Amount and/or Complexity of Data Reviewed Labs: ordered.    Details: Significant labs including glucose of 820, he has a creatinine of 2.30 with a normal BUN at 19, urine is negative for UTI.  He has a sodium of 124, corrected for glucose at 136. Radiology: ordered.    Details: CT head negative for acute injury. Discussion of management or test interpretation with external provider(s): Patient was discussed with Dr. Sherlon who evaluate and  admit patient.  Risk Decision regarding hospitalization.        Final diagnoses:  Hyperglycemia  AKI (acute kidney injury)    ED Discharge Orders     None          Birdena Clarity, PA-C 11/25/24 1807

## 2024-11-25 NOTE — ED Triage Notes (Signed)
 Pt arrived via POV c/o hyperglycemia for past few days and reports he fell outside while trying to pick something up and he reports hitting his right eye and experiencing LOC. Pts CBG in Triage is > 600.

## 2024-11-26 ENCOUNTER — Other Ambulatory Visit (HOSPITAL_COMMUNITY): Payer: Self-pay

## 2024-11-26 ENCOUNTER — Telehealth (HOSPITAL_COMMUNITY): Payer: Self-pay | Admitting: Pharmacy Technician

## 2024-11-26 LAB — HEMOGLOBIN A1C
Hgb A1c MFr Bld: 14.1 % — ABNORMAL HIGH (ref 4.8–5.6)
Mean Plasma Glucose: 357.97 mg/dL

## 2024-11-26 LAB — CBC
HCT: 35.8 % — ABNORMAL LOW (ref 39.0–52.0)
Hemoglobin: 11.9 g/dL — ABNORMAL LOW (ref 13.0–17.0)
MCH: 32.2 pg (ref 26.0–34.0)
MCHC: 33.2 g/dL (ref 30.0–36.0)
MCV: 96.8 fL (ref 80.0–100.0)
Platelets: 270 K/uL (ref 150–400)
RBC: 3.7 MIL/uL — ABNORMAL LOW (ref 4.22–5.81)
RDW: 11.8 % (ref 11.5–15.5)
WBC: 8.3 K/uL (ref 4.0–10.5)
nRBC: 0 % (ref 0.0–0.2)

## 2024-11-26 LAB — BASIC METABOLIC PANEL WITH GFR
Anion gap: 5 (ref 5–15)
BUN: 13 mg/dL (ref 8–23)
CO2: 32 mmol/L (ref 22–32)
Calcium: 9.5 mg/dL (ref 8.9–10.3)
Chloride: 100 mmol/L (ref 98–111)
Creatinine, Ser: 1.84 mg/dL — ABNORMAL HIGH (ref 0.61–1.24)
GFR, Estimated: 36 mL/min — ABNORMAL LOW (ref >60)
Glucose, Bld: 183 mg/dL — ABNORMAL HIGH (ref 70–99)
Potassium: 4.3 mmol/L (ref 3.5–5.1)
Sodium: 138 mmol/L (ref 135–145)

## 2024-11-26 LAB — GLUCOSE, CAPILLARY
Glucose-Capillary: 152 mg/dL — ABNORMAL HIGH (ref 70–99)
Glucose-Capillary: 152 mg/dL — ABNORMAL HIGH (ref 70–99)
Glucose-Capillary: 163 mg/dL — ABNORMAL HIGH (ref 70–99)
Glucose-Capillary: 216 mg/dL — ABNORMAL HIGH (ref 70–99)
Glucose-Capillary: 436 mg/dL — ABNORMAL HIGH (ref 70–99)

## 2024-11-26 MED ORDER — GLIPIZIDE 2.5 MG PO TABS: 2.5000 mg | ORAL_TABLET | Freq: Two times a day (BID) | ORAL | Status: AC

## 2024-11-26 MED ORDER — NICOTINE 21 MG/24HR TD PT24: 21.0000 mg | MEDICATED_PATCH | Freq: Every day | TRANSDERMAL | 0 refills | Status: AC

## 2024-11-26 NOTE — Progress Notes (Signed)
°   11/26/24 1052  TOC Brief Assessment  Insurance and Status Reviewed  Patient has primary care physician Yes  Home environment has been reviewed From Home  Prior level of function: Independent  Prior/Current Home Services No current home services  Social Drivers of Health Review SDOH reviewed no interventions necessary  Readmission risk has been reviewed Yes  Transition of care needs no transition of care needs at this time   Inpatient Care Management (ICM) has reviewed patient and no other ICM needs have been identified at this time. We will continue to monitor patient advancement through interdisciplinary progression rounds. If new patient transition needs arise, please place a ICM consult.

## 2024-11-26 NOTE — Discharge Summary (Signed)
 Physician Discharge Summary   Patient: Alan Watson MRN: [id number] DOB: 12-10-1941  Admit date:     11/25/2024  Discharge date: 11/26/2024  Discharge Physician: Carliss LELON Canales   PCP: Katrinka Aquas, MD   Recommendations at discharge:    Pt to be discharged home.   If you experience worsening fever, chills, chest pain, shortness of breath, or other concerning symptoms, please call your PCP or go to the emergency department immediately.  Discharge Diagnoses: Principal Problem:   AKI (acute kidney injury) Active Problems:   Uncontrolled type 2 diabetes mellitus with hyperglycemia, without long-term current use of insulin  (HCC)  Resolved Problems:   * No resolved hospital problems. *   Hospital Course:  83 y.o. male, with past medical history of hypertension, type 2 diabetes, CKD stage IIIb, tobacco use, patient presents to ED secondary to concern of hyperglycemia, he reports polydipsia, polyuria, blurry vision, and his blood sugar uncontrolled, frequent changes of his oral regimen recently, glipizide  was discontinued in August due to hyperglycemia and A1c of 5.6, Jardiance was dispensed but he could not afford so it was never received, and he was recently started on glimepiride 1 mg, dose uptitrated due to uncontrolled hyperglycemia, reports weakness, had a fall today, no loss of consciousness, reports he hit his right forehead and eye on the grass, chest pain, no shortness of breath. - ED his workup significant for glucose 820, but no DKA, as bicarb within normal limit, anion gap normal as well, with pH of 7.4, labs significant for glucosuria but no ketonuria, creatinine up to 2.3, baseline 1.7 on close well clinic records this year, [facility] consulted to admit  Assessment and Plan:  Uncontrolled diabetes mellitus type 2 - Uptrending A1c, 9.6 on 10/19/2024.  Glucose greater than 800 on presentation.  No acidemia or AKI to suggest DKA/HHS.  Initiated on insulin  drip with  prompt improvement in glucose.  Subsequently transition to subcu insulin  with insulin  sliding scale.  Discussion with patient about need for insulin  therapy.  Will transition patient to insulin  glargine 10 units nightly.  Can resume glipizide .  Would recommend close follow-up with primary care provider to titrate insulin  regiment and possibly add short acting insulin .  Informed patient about long-term consequences of uncontrolled diabetes including advanced kidney disease, blindness, neuropathy, and increased cardiovascular risk of stroke and heart attack.   Recommend yearly foot/eye/urine protein exams.  Hypertension - Resume home medication regimen.  AKI CKD 3B - Improved after IV fluid hydration.  Recommend continued oral hydration.    Consultants: None Procedures performed: None Disposition: Home Diet recommendation:  Discharge Diet Orders (From admission, onward)     Start     Ordered   11/26/24 0000  Diet Carb Modified        11/26/24 1128           Carb modified diet  DISCHARGE MEDICATION: Allergies as of 11/26/2024       Reactions   Lisinopril    Angioedema        Medication List     TAKE these medications    amLODipine 10 MG tablet Commonly known as: NORVASC Take 10 mg by mouth daily.   aspirin EC 81 MG tablet Take 81 mg by mouth daily.   Blood Glucose Monitoring Suppl Devi 1 each by Does not apply route as directed. Dispense based on patient and insurance preference. Use up to four times daily as directed. (FOR ICD-10 E10.9, E11.9).   BLOOD GLUCOSE TEST STRIPS Strp 1 each  by Does not apply route as directed. Dispense based on patient and insurance preference. Use up to four times daily as directed. (FOR ICD-10 E10.9, E11.9).   glipiZIDE  2.5 MG Tabs Take 2.5 mg by mouth 2 (two) times daily before a meal. What changed:  medication strength how much to take   insulin  glargine 100 UNIT/ML Solostar Pen Commonly known as: LANTUS  Inject 10 Units into  the skin at bedtime. May substitute as needed per insurance.   Lancet Device Misc 1 each by Does not apply route as directed. Dispense based on patient and insurance preference. Use up to four times daily as directed. (FOR ICD-10 E10.9, E11.9).   Lancets Misc 1 each by Does not apply route as directed. Dispense based on patient and insurance preference. Use up to four times daily as directed. (FOR ICD-10 E10.9, E11.9).   lisinopril-hydrochlorothiazide 20-12.5 MG tablet Commonly known as: ZESTORETIC Take 2 tablets by mouth daily.   nicotine  21 mg/24hr patch Commonly known as: NICODERM CQ  - dosed in mg/24 hours Place 1 patch (21 mg total) onto the skin daily. Start taking on: November 27, 2024   Pen Needles 31G X 5 MM Misc 1 each by Does not apply route as directed. Dispense based on patient and insurance preference. Use up to four times daily as directed. (FOR ICD-10 E10.9, E11.9).         Discharge Exam: Filed Weights   11/25/24 1309 11/25/24 1839  Weight: 60.3 kg 59.8 kg    GENERAL:  Alert, pleasant, no acute distress  HEENT:  EOMI CARDIOVASCULAR:  RRR, no murmurs appreciated RESPIRATORY:  Clear to auscultation, no wheezing, rales, or rhonchi GASTROINTESTINAL:  Soft, nontender, nondistended EXTREMITIES:  No LE edema bilaterally NEURO:  No new focal deficits appreciated SKIN:  No rashes noted PSYCH:  Appropriate mood and affect     Condition at discharge: improving  The results of significant diagnostics from this hospitalization (including imaging, microbiology, ancillary and laboratory) are listed below for reference.   Imaging Studies: CT Head Wo Contrast Result Date: 11/25/2024 EXAM: CT HEAD WITHOUT CONTRAST 11/25/2024 03:25:45 PM TECHNIQUE: CT of the head was performed without the administration of intravenous contrast. Automated exposure control, iterative reconstruction, and/or weight based adjustment of the mA/kV was utilized to reduce the radiation dose to  as low as reasonably achievable. COMPARISON: None available. CLINICAL HISTORY: Head trauma, minor (Age >= 65y) FINDINGS: BRAIN AND VENTRICLES: No acute hemorrhage. Scattered white matter hypodensities which are nonspecific but most commonly represent chronic microvascular ischemic changes. Nonspecific coarse calcification left parietal region. No evidence of acute infarct. No hydrocephalus. No extra-axial collection. No mass effect or midline shift. ORBITS: There is prominent orbital fat and mild bilateral orbital proptosis. SINUSES: No acute abnormality. Mild right mastoid effusion. SOFT TISSUES AND SKULL: No acute soft tissue abnormality. No skull fracture. IMPRESSION: 1. No acute intracranial hemorrhage or calvarial fracture. Electronically signed by: Prentice Spade MD 11/25/2024 04:03 PM EST RP Workstation: [id number]    Microbiology: Results for orders placed or performed during the hospital encounter of 11/25/24  MRSA Next Gen by PCR, Nasal     Status: None   Collection Time: 11/25/24  6:35 PM   Specimen: Nasal Mucosa; Nasal Swab  Result Value Ref Range Status   MRSA by PCR Next Gen NOT DETECTED NOT DETECTED Final    Comment: (NOTE) The GeneXpert MRSA Assay (FDA approved for NASAL specimens only), is one component of a comprehensive MRSA colonization surveillance program. It is not intended to diagnose  MRSA infection nor to guide or monitor treatment for MRSA infections. Test performance is not FDA approved in patients less than 58 years old. Performed at [facility], [address] [place], [place] [address]     Labs: CBC: Recent Labs  Lab 11/25/24 1404 11/26/24 0456  WBC 6.9 8.3  NEUTROABS 4.1  --   HGB 12.5* 11.9*  HCT 37.4* 35.8*  MCV 96.6 96.8  PLT 272 270   Basic Metabolic Panel: Recent Labs  Lab 11/25/24 1404 11/25/24 2125 11/26/24 0456  NA 124* 137 138  K 4.4 3.5 4.3  CL 86* 101 100  CO2 23 24 32  GLUCOSE 820* 103* 183*  BUN 19 15 13   CREATININE 2.30* 1.87*  1.84*  CALCIUM 9.5 9.4 9.5  MG 1.8  --   --    Liver Function Tests: Recent Labs  Lab 11/25/24 1404  AST 22  ALT 15  ALKPHOS 141*  BILITOT 0.6  PROT 6.8  ALBUMIN 4.2   CBG: Recent Labs  Lab 11/26/24 0112 11/26/24 0213 11/26/24 0407 11/26/24 0737 11/26/24 1121  GLUCAP 152* 152* 163* 216* 436*    Discharge time spent: 35 minutes.  Length of inpatient stay: 1 days  Signed: Carliss LELON Canales, DO [facility] 11/26/2024

## 2024-11-26 NOTE — Care Management Obs Status (Signed)
 MEDICARE OBSERVATION STATUS NOTIFICATION   Patient Details  Name: Kaeson Kleinert MRN: [id number] Date of Birth: [birth year]   Medicare Observation Status Notification Given:  Yes    Noreen KATHEE Pinal, LCSWA 11/26/2024, 11:51 AM

## 2024-11-26 NOTE — Inpatient Diabetes Management (Signed)
 Inpatient Diabetes Program Recommendations  AACE/ADA: New Consensus Statement on Inpatient Glycemic Control (2015)  Target Ranges:  Prepandial:   less than 140 mg/dL      Peak postprandial:   less than 180 mg/dL (1-2 hours)      Critically ill patients:  140 - 180 mg/dL   Lab Results  Component Value Date   GLUCAP 216 (H) 11/26/2024    Review of Glycemic Control  Diabetes history: DM 2 Outpatient Diabetes medications: Amaryl 1 mg Daily, Actos 30 mg Daily Current orders for Inpatient glycemic control:  Lantus  5 units Novolog  0-9 units tid + hs Glipizide  2.5 mg bid before meals  Note declines use of insulin  as an outpatient Glucose controlled by glipizide  (dose was 5 mg bid) a few months ago but was having hypoglycemia and A1c 5.8% on 8/4, started on actos 30 and jardiance 10 mg on 11/10, restarted lower dose sulfonylurea Amaryl 1 mg on 12/10 and jardiance stopped.  Inpatient Diabetes Program Recommendations:    -   Consider discontinuing basal insulin  to see glucose trends on current oral medication ordered. -   Consider Tradjenta 5 mg Daily (new) -   restart Actos 30 mg Daily (home dose) -   Change Glipizide  2.5 mg to home Amaryl, start at 2 mg bid   Thanks,  Clotilda Bull RN, MSN, BC-ADM Inpatient Diabetes Coordinator Team Pager [phone] (8a-5p)

## 2024-11-26 NOTE — Care Management CC44 (Signed)
 Condition Code 44 Documentation Completed  Patient Details  Name: Blayde Bacigalupi MRN: [id number] Date of Birth: May 28, 1941   Condition Code 44 given:  Yes Patient signature on Condition Code 44 notice:  Yes Documentation of 2 MD's agreement:  Yes Code 44 added to claim:  Yes    Noreen KATHEE Pinal, LCSWA 11/26/2024, 11:51 AM

## 2024-11-26 NOTE — Telephone Encounter (Signed)
 Patient Product/process Development Scientist completed.    The patient is insured through [employer]. Patient has Medicare and is not eligible for a copay card, but may be able to apply for patient assistance or Medicare RX Payment Plan (Patient Must reach out to their plan, if eligible for payment plan), if available.    Ran test claim for Lantus  Pen and the current 30 day co-pay is $35.00.   This test claim was processed through [facility]- copay amounts may vary at other pharmacies due to pharmacy/plan contracts, or as the patient moves through the different stages of their insurance plan.     Reyes Sharps, CPHT Pharmacy Technician Patient Advocate Specialist Lead [facility] Patient Advocate Team Direct Number: [phone]  Fax: [phone]
